# Patient Record
Sex: Male | Born: 1955 | Race: Black or African American | Hispanic: No | Marital: Married | Smoking: Never smoker
Health system: Southern US, Community
[De-identification: ages and names within clinical notes are randomized; demographics above are authoritative.]

## PROBLEM LIST (undated history)

## (undated) DIAGNOSIS — I10 Essential (primary) hypertension: Secondary | ICD-10-CM

## (undated) DIAGNOSIS — E119 Type 2 diabetes mellitus without complications: Secondary | ICD-10-CM

---

## 2013-11-23 DIAGNOSIS — G4731 Primary central sleep apnea: Secondary | ICD-10-CM | POA: Insufficient documentation

## 2013-11-23 DIAGNOSIS — R5383 Other fatigue: Secondary | ICD-10-CM | POA: Insufficient documentation

## 2013-11-23 DIAGNOSIS — G4710 Hypersomnia, unspecified: Secondary | ICD-10-CM | POA: Insufficient documentation

## 2013-11-23 DIAGNOSIS — R0683 Snoring: Secondary | ICD-10-CM | POA: Insufficient documentation

## 2013-11-24 DIAGNOSIS — N3281 Overactive bladder: Secondary | ICD-10-CM | POA: Insufficient documentation

## 2013-11-24 DIAGNOSIS — E119 Type 2 diabetes mellitus without complications: Secondary | ICD-10-CM | POA: Insufficient documentation

## 2013-11-24 DIAGNOSIS — R351 Nocturia: Secondary | ICD-10-CM | POA: Insufficient documentation

## 2013-11-24 DIAGNOSIS — N400 Enlarged prostate without lower urinary tract symptoms: Secondary | ICD-10-CM | POA: Insufficient documentation

## 2013-11-24 DIAGNOSIS — I10 Essential (primary) hypertension: Secondary | ICD-10-CM | POA: Insufficient documentation

## 2013-11-24 DIAGNOSIS — R350 Frequency of micturition: Secondary | ICD-10-CM | POA: Insufficient documentation

## 2017-09-18 ENCOUNTER — Encounter (HOSPITAL_BASED_OUTPATIENT_CLINIC_OR_DEPARTMENT_OTHER): Payer: Self-pay | Admitting: *Deleted

## 2017-09-18 ENCOUNTER — Other Ambulatory Visit: Payer: Self-pay

## 2017-09-18 DIAGNOSIS — Y998 Other external cause status: Secondary | ICD-10-CM | POA: Diagnosis not present

## 2017-09-18 DIAGNOSIS — X102XXA Contact with fats and cooking oils, initial encounter: Secondary | ICD-10-CM | POA: Insufficient documentation

## 2017-09-18 DIAGNOSIS — T25232A Burn of second degree of left toe(s) (nail), initial encounter: Secondary | ICD-10-CM | POA: Insufficient documentation

## 2017-09-18 DIAGNOSIS — Y939 Activity, unspecified: Secondary | ICD-10-CM | POA: Insufficient documentation

## 2017-09-18 DIAGNOSIS — T25032A Burn of unspecified degree of left toe(s) (nail), initial encounter: Secondary | ICD-10-CM | POA: Diagnosis present

## 2017-09-18 DIAGNOSIS — Y929 Unspecified place or not applicable: Secondary | ICD-10-CM | POA: Insufficient documentation

## 2017-09-18 NOTE — ED Notes (Signed)
Pt provided with cool, moist washcloths to relieve pain. Pt offered tylenol or ibuprofen-refuses.
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
## 2017-09-19 ENCOUNTER — Other Ambulatory Visit: Payer: Self-pay

## 2017-09-19 ENCOUNTER — Emergency Department (HOSPITAL_BASED_OUTPATIENT_CLINIC_OR_DEPARTMENT_OTHER)
Admission: EM | Admit: 2017-09-19 | Discharge: 2017-09-19 | Disposition: A | Discharge status: Home / Self Care | Attending: Emergency Medicine | Admitting: Emergency Medicine

## 2017-09-19 DIAGNOSIS — T25232A Burn of second degree of left toe(s) (nail), initial encounter: Secondary | ICD-10-CM

## 2017-09-19 MED ORDER — HYDROCODONE-ACETAMINOPHEN 5-325 MG PO TABS: 1.0000 | ORAL_TABLET | Freq: Once | ORAL | Status: DC

## 2017-09-19 MED ORDER — HYDROCODONE-ACETAMINOPHEN 5-325 MG PO TABS: 1.0000 | ORAL_TABLET | ORAL | 0 refills | Status: DC | PRN

## 2017-09-19 MED ORDER — BACITRACIN ZINC 500 UNIT/GM EX OINT: 1.0000 "application " | TOPICAL_OINTMENT | Freq: Two times a day (BID) | CUTANEOUS | 0 refills | Status: DC

## 2017-09-19 NOTE — Discharge Instructions (Signed)
Keep burn clean and dry.  Apply the antibiotic ointment after the blister opens.  Follow-up with the foot doctor next week.  Return to the ED if you develop new or worsening symptoms.
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
## 2017-09-19 NOTE — ED Notes (Signed)
PMS intact before and after. Pt tolerated well. All questions answered. 

## 2017-09-19 NOTE — ED Notes (Signed)
Pt declined another set of vital signs. States he will take his BP meds at home.

## 2017-10-13 ENCOUNTER — Other Ambulatory Visit: Payer: Self-pay

## 2017-10-13 ENCOUNTER — Ambulatory Visit (INDEPENDENT_AMBULATORY_CARE_PROVIDER_SITE_OTHER): Admitting: Podiatry

## 2017-10-13 ENCOUNTER — Ambulatory Visit: Payer: Self-pay

## 2017-10-13 DIAGNOSIS — L97522 Non-pressure chronic ulcer of other part of left foot with fat layer exposed: Secondary | ICD-10-CM | POA: Diagnosis not present

## 2017-10-13 DIAGNOSIS — E08621 Diabetes mellitus due to underlying condition with foot ulcer: Secondary | ICD-10-CM | POA: Diagnosis not present

## 2017-10-13 DIAGNOSIS — E0843 Diabetes mellitus due to underlying condition with diabetic autonomic (poly)neuropathy: Secondary | ICD-10-CM | POA: Diagnosis not present

## 2017-10-13 DIAGNOSIS — T25322A Burn of third degree of left foot, initial encounter: Secondary | ICD-10-CM
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
## 2017-11-03 ENCOUNTER — Encounter: Admitting: Podiatry

## 2017-11-09 NOTE — Progress Notes (Signed)
This encounter was created in error - please disregard.

## 2019-10-07 ENCOUNTER — Emergency Department (HOSPITAL_BASED_OUTPATIENT_CLINIC_OR_DEPARTMENT_OTHER)
Admission: EM | Admit: 2019-10-07 | Discharge: 2019-10-07 | Disposition: A | Discharge status: Home / Self Care | Attending: Emergency Medicine | Admitting: Emergency Medicine

## 2019-10-07 ENCOUNTER — Emergency Department (HOSPITAL_BASED_OUTPATIENT_CLINIC_OR_DEPARTMENT_OTHER)

## 2019-10-07 ENCOUNTER — Other Ambulatory Visit: Payer: Self-pay

## 2019-10-07 DIAGNOSIS — Z79899 Other long term (current) drug therapy: Secondary | ICD-10-CM | POA: Diagnosis not present

## 2019-10-07 DIAGNOSIS — G5700 Lesion of sciatic nerve, unspecified lower limb: Secondary | ICD-10-CM | POA: Diagnosis not present

## 2019-10-07 DIAGNOSIS — Y929 Unspecified place or not applicable: Secondary | ICD-10-CM | POA: Diagnosis not present

## 2019-10-07 DIAGNOSIS — W1789XA Other fall from one level to another, initial encounter: Secondary | ICD-10-CM | POA: Insufficient documentation

## 2019-10-07 DIAGNOSIS — E119 Type 2 diabetes mellitus without complications: Secondary | ICD-10-CM | POA: Diagnosis not present

## 2019-10-07 DIAGNOSIS — Y939 Activity, unspecified: Secondary | ICD-10-CM | POA: Insufficient documentation

## 2019-10-07 DIAGNOSIS — W19XXXA Unspecified fall, initial encounter: Secondary | ICD-10-CM

## 2019-10-07 DIAGNOSIS — M7918 Myalgia, other site: Secondary | ICD-10-CM

## 2019-10-07 DIAGNOSIS — M79632 Pain in left forearm: Secondary | ICD-10-CM

## 2019-10-07 DIAGNOSIS — I10 Essential (primary) hypertension: Secondary | ICD-10-CM | POA: Insufficient documentation

## 2019-10-07 DIAGNOSIS — S43422A Sprain of left rotator cuff capsule, initial encounter: Secondary | ICD-10-CM | POA: Diagnosis not present

## 2019-10-07 DIAGNOSIS — Z7984 Long term (current) use of oral hypoglycemic drugs: Secondary | ICD-10-CM | POA: Insufficient documentation

## 2019-10-07 DIAGNOSIS — Y999 Unspecified external cause status: Secondary | ICD-10-CM | POA: Insufficient documentation

## 2019-10-07 DIAGNOSIS — S46002A Unspecified injury of muscle(s) and tendon(s) of the rotator cuff of left shoulder, initial encounter: Secondary | ICD-10-CM | POA: Diagnosis present

## 2019-10-07 MED ORDER — CYCLOBENZAPRINE HCL 10 MG PO TABS
10.0000 mg | ORAL_TABLET | Freq: Once | ORAL | Status: AC
  Administered 2019-10-07: 10 mg via ORAL
  Filled 2019-10-07: qty 1

## 2019-10-07 MED ORDER — HYDRALAZINE HCL 25 MG PO TABS
50.0000 mg | ORAL_TABLET | Freq: Once | ORAL | Status: AC
  Administered 2019-10-07: 50 mg via ORAL
  Filled 2019-10-07: qty 2

## 2019-10-07 MED ORDER — CYCLOBENZAPRINE HCL 10 MG PO TABS
10.0000 mg | ORAL_TABLET | Freq: Two times a day (BID) | ORAL | 0 refills | Status: DC | PRN
Start: 2019-10-07 — End: 2019-12-13

## 2019-10-07 MED ORDER — IBUPROFEN 400 MG PO TABS
400.0000 mg | ORAL_TABLET | Freq: Once | ORAL | Status: AC
  Administered 2019-10-07: 400 mg via ORAL
  Filled 2019-10-07: qty 1

## 2019-10-07 MED ORDER — LIDOCAINE 5 % EX PTCH: 1.0000 | MEDICATED_PATCH | CUTANEOUS | 0 refills | Status: DC

## 2019-10-07 NOTE — ED Notes (Signed)
Patient transported to X-ray 

## 2019-10-07 NOTE — ED Provider Notes (Signed)
[place] EMERGENCY DEPARTMENT Provider Note   CSN: [id number] Arrival date & time: 10/07/19  0645     History Chief Complaint  Patient presents with  . Leg Injury  . Arm Injury    Samuel Atkins is a 64 y.o. male.  HPI      Was on other side of brick wall, about 2 feet up from pavement, was shoveling and stepped wrong and hit the wall and fell 2 feet down onto the concrete onto left side.  Had pain left arm and leg at that time, (shoulder, hip)-was not seen initially though it would get better  Continuing to have pain to left forearm radial side closer to elbow, shoulder pain with lifting, begins to getpain just before 90 degrees Has been taking aspirins 2 500mg  aspirins a day Still having some hip pain 4-5/10 At night it increases   No head trauma, no headaches, no LOC No neck pain, back pain  No numbness, no weakness Had not taken blood pressure medications this AM    Patient Active Problem List   Diagnosis Date Noted  . Benign prostatic hyperplasia 11/24/2013  . Diabetes mellitus ([place]) 11/24/2013  . Hypertension 11/24/2013  . Increased frequency of urination 11/24/2013  . Nocturia 11/24/2013  . Overactive bladder 11/24/2013  . Snoring 11/23/2013  . Other fatigue 11/23/2013  . Hypersomnia 11/23/2013  . Central sleep apnea 11/23/2013    No past surgical history on file.     No family history on file.  Social History   Tobacco Use  . Smoking status: Never Smoker  . Smokeless tobacco: Never Used  Substance Use Topics  . Alcohol use: Yes    Comment: socially  . Drug use: Not Currently    Home Medications Prior to Admission medications   Medication Sig Start Date End Date Taking? Authorizing Provider  amLODipine-benazepril (LOTREL) 5-40 MG capsule Take by mouth.    [provider]  atorvastatin (LIPITOR) 40 MG tablet Take by mouth.    [provider]  bacitracin ointment Apply 1 application topically 2 (two) times  daily. Apply to burn site after blister opens 09/19/17   Rancour, Annie Main, MD  canagliflozin (INVOKANA) 300 MG TABS tablet Take by mouth.    [provider]  Cholecalciferol (D-5000) 5000 units TABS Take by mouth.    [provider]  cyclobenzaprine (FLEXERIL) 10 MG tablet Take 1 tablet (10 mg total) by mouth 2 (two) times daily as needed for muscle spasms. 10/07/19   Gareth Morgan, MD  doxycycline (VIBRAMYCIN) 100 MG capsule  09/20/17   [provider]  glimepiride (AMARYL) 4 MG tablet  09/07/17   [provider]  hydrALAZINE (APRESOLINE) 50 MG tablet Take by mouth.    [provider]  hydrochlorothiazide (HYDRODIURIL) 25 MG tablet Take 25 mg by mouth daily.    [provider]  HYDROcodone-acetaminophen (NORCO/VICODIN) 5-325 MG tablet Take 1 tablet by mouth every 4 (four) hours as needed. 09/19/17   Rancour, Annie Main, MD  lidocaine (LIDODERM) 5 % Place 1 patch onto the skin daily. Remove & Discard patch within 12 hours or as directed by MD 10/07/19   Gareth Morgan, MD  metFORMIN (GLUCOPHAGE) 1000 MG tablet Take 1,000 mg by mouth 2 (two) times daily with a meal.    [provider]  potassium chloride (K-DUR) 10 MEQ tablet  09/16/17   [provider]  sitaGLIPtin (JANUVIA) 50 MG tablet Take 50 mg by mouth daily.    [provider]  SitaGLIPtin-MetFORMIN HCl (JANUMET XR) [phone] MG TB24  10/17/13   [provider]  solifenacin (VESICARE) 5 MG tablet Take by mouth.    [provider]  tadalafil (CIALIS) 10 MG tablet  11/14/13   [provider]  tolterodine (DETROL LA) 4 MG 24 hr capsule  10/15/13   [provider]  Vitamin D, Ergocalciferol, (DRISDOL) 50000 units CAPS capsule  10/31/13   [provider]    Allergies    Patient has no known allergies.  Review of Systems   Review of Systems  Constitutional: Negative for fever.  Eyes: Negative for visual disturbance.   Respiratory: Negative for shortness of breath.   Cardiovascular: Negative for chest pain.  Gastrointestinal: Negative for abdominal pain, nausea and vomiting.  Genitourinary: Negative for difficulty urinating.  Musculoskeletal: Positive for arthralgias. Negative for back pain and neck pain.  Skin: Negative for rash.  Neurological: Negative for dizziness, syncope, facial asymmetry, weakness, numbness and headaches.    Physical Exam Updated Vital Signs BP (!) 196/108 (BP Location: Right Arm)   Pulse (!) 57   Temp 98.2 F (36.8 C) (Oral)   Resp 16   Ht 5' 7.5" (1.715 m)   Wt 102.1 kg   SpO2 100%   BMI 34.72 kg/m   Physical Exam Vitals and nursing note reviewed.  Constitutional:      General: He is not in acute distress.    Appearance: Normal appearance. He is not ill-appearing, toxic-appearing or diaphoretic.  HENT:     Head: Normocephalic.  Eyes:     Conjunctiva/sclera: Conjunctivae normal.  Cardiovascular:     Rate and Rhythm: Normal rate and regular rhythm.     Pulses: Normal pulses.  Pulmonary:     Effort: Pulmonary effort is normal. No respiratory distress.  Musculoskeletal:        General: No deformity or signs of injury.     Cervical back: No rigidity.     Comments: Limited ROM left shoulder, able to raise to just below 90 degrees, significant pain at that point and states cannot raise higher Normal elbow ROM Tenderness radial left forearm Normal wrist ROM and strength, normal opponens, finger abduction  Tenderness sciatic notch, left hip Normal ROM hip Tenderness dorsum left hand 4th MTP, normal finger extension and flexion  Skin:    General: Skin is warm and dry.     Coloration: Skin is not jaundiced or pale.  Neurological:     General: No focal deficit present.     Mental Status: He is alert and oriented to person, place, and time.     ED Results / Procedures / Treatments   Labs (all labs ordered are listed, but only abnormal results are  displayed) Labs Reviewed - No data to display  EKG None  Radiology DG Forearm Left  Result Date: 10/07/2019 CLINICAL DATA:  Status post fall 3 weeks ago with left arm pain. EXAM: LEFT FOREARM - 2 VIEW COMPARISON:  None. FINDINGS: There is no evidence of fracture or other focal bone lesions. Soft tissues are unremarkable. IMPRESSION: Negative. Electronically Signed   By: Abelardo Diesel M.D.   On: 10/07/2019 08:21   DG Shoulder Left  Result Date: 10/07/2019 CLINICAL DATA:  Status post fall 3 weeks ago with left shoulder pain EXAM: LEFT SHOULDER - 2+ VIEW COMPARISON:  None. FINDINGS: There is no evidence of fracture or dislocation. Degenerative joint changes of the left acromioclavicular joint with osteophyte formation are noted. Soft tissues are unremarkable. IMPRESSION:  No acute fracture or dislocation. Electronically Signed   By: Abelardo Diesel M.D.   On: 10/07/2019 08:23   DG Hand Complete Left  Result Date: 10/07/2019 CLINICAL DATA:  Status post fall 3 weeks ago with left hand pain EXAM: LEFT HAND - COMPLETE 3+ VIEW COMPARISON:  None. FINDINGS: There is no evidence of fracture or dislocation. Soft tissues are unremarkable. IMPRESSION: Negative. Electronically Signed   By: Abelardo Diesel M.D.   On: 10/07/2019 08:25   DG Hip Unilat W or Wo Pelvis 2-3 Views Left  Result Date: 10/07/2019 CLINICAL DATA:  Status post fall 3 weeks ago with left EXAM: DG HIP (WITH OR WITHOUT PELVIS) 2-3V LEFT COMPARISON:  None. FINDINGS: There is no evidence of hip fracture or dislocation. Degenerative joint changes of the spine and bilateral hips are identified. There is no evidence of arthropathy or other focal bone abnormality. IMPRESSION: No acute fracture or dislocation. Electronically Signed   By: Abelardo Diesel M.D.   On: 10/07/2019 08:22    Procedures Procedures (including critical care time)  Medications Ordered in ED Medications  hydrALAZINE (APRESOLINE) tablet 50 mg (50 mg Oral Given 10/07/19 0759)   cyclobenzaprine (FLEXERIL) tablet 10 mg (10 mg Oral Given 10/07/19 0759)  ibuprofen (ADVIL) tablet 400 mg (400 mg Oral Given 10/07/19 0759)    ED Course  I have reviewed the triage vital signs and the nursing notes.  Pertinent labs & imaging results that were available during my care of the patient were reviewed by me and considered in my medical decision making (see chart for details).    MDM Rules/Calculators/A&P                          65yo male with history above presents with concern for fall 3 weeks ago with continuing pain to left shoulder, forearm and hip.  Doubt intracranial, cervical or other injuries.   XR show no sign of fracture. Suspect rotator cuff pathology. Given sling and recommend ROM activities and follow up with sports medicine who may recommend surgical orthopedic follow up pending evaluation.  Given rx for flexeril, lidoderm, recommend ibuprofen/tylenol. No known hx of renal disease.   Hypertension today however he has not taken his morning medications and is asymptomatic. No signs of hypertensive emergency by history or exam.  Given home dose of hydralazine and recommend taking other regular medications upon returning home. Patient discharged in stable condition with understanding of reasons to return.   Final Clinical Impression(s) / ED Diagnoses Final diagnoses:  Fall, initial encounter  Sprain of left rotator cuff capsule, initial encounter  Piriformis muscle pain  Pain of left forearm    Rx / DC Orders ED Discharge Orders         Ordered    cyclobenzaprine (FLEXERIL) 10 MG tablet  2 times daily PRN     Discontinue  Reprint     10/07/19 0829    lidocaine (LIDODERM) 5 %  Every 24 hours     Discontinue  Reprint     10/07/19 0300           Gareth Morgan, MD 10/07/19 [phone]

## 2019-10-07 NOTE — ED Triage Notes (Signed)
Pt states fell on left side on concrete 3 weeks ago, soreness along left arm and leg, has been taking aspirin 650mg  each day with little improvement.

## 2019-10-12 ENCOUNTER — Ambulatory Visit: Payer: Self-pay

## 2019-10-12 ENCOUNTER — Other Ambulatory Visit: Payer: Self-pay

## 2019-10-12 ENCOUNTER — Ambulatory Visit (INDEPENDENT_AMBULATORY_CARE_PROVIDER_SITE_OTHER): Admitting: Family Medicine

## 2019-10-12 VITALS — BP 148/86 | Ht 68.0 in | Wt 225.0 lb

## 2019-10-12 DIAGNOSIS — S43002A Unspecified subluxation of left shoulder joint, initial encounter: Secondary | ICD-10-CM

## 2019-10-12 DIAGNOSIS — M25512 Pain in left shoulder: Secondary | ICD-10-CM

## 2019-10-12 NOTE — Patient Instructions (Signed)
Nice to meet you Please try ice  Please continue the sling for one week and then wean out  Please try the range of motion exercises  Please try the duexis for 3 days straight and then as needed   Please send me a message in MyChart with any questions or updates.  Please see me back in 2 weeks.   --Dr. Raeford Razor

## 2019-10-12 NOTE — Assessment & Plan Note (Signed)
Trauma happened about 3 weeks ago.  Symptoms are still ongoing.  Ultrasound was revealing for effusion and likely traumatic bursitis.  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 -Counseled on home exercise therapy and supportive care. -Provided Duexis samples. -If no improvement consider glenohumeral injection and physical therapy.

## 2019-10-12 NOTE — Progress Notes (Signed)
Samuel Atkins - 64 y.o. male MRN [id number]  Date of birth: [birth year]  SUBJECTIVE:  Including CC & ROS.  No chief complaint on file.   Samuel Atkins is a 64 y.o. male that is presenting with left shoulder pain.  He recently had a fall from a fairly decent height.  Since that time he has had pain of the localized left shoulder.  Has been using a sling.  Was seen in the emergency department.  Pain is worse with any external rotation or abduction..  Independent review of the left shoulder x-ray from 7/3 shows degenerative changes of the [facility] joint.  Independent review of the left forearm x-ray from 7/3 shows no acute abnormalities.   Review of Systems See HPI   HISTORY: Past Medical, Surgical, Social, and Family History Reviewed & Updated per EMR.   Pertinent Historical Findings include:  No past medical history on file.  No past surgical history on file.  No family history on file.  Social History   Socioeconomic History  . Marital status: Married    Spouse name: Not on file  . Number of children: Not on file  . Years of education: Not on file  . Highest education level: Not on file  Occupational History  . Not on file  Tobacco Use  . Smoking status: Never Smoker  . Smokeless tobacco: Never Used  Substance and Sexual Activity  . Alcohol use: Yes    Comment: socially  . Drug use: Not Currently  . Sexual activity: Not on file  Other Topics Concern  . Not on file  Social History Narrative  . Not on file   Social Determinants of Health   Financial Resource Strain:   . Difficulty of Paying Living Expenses:   Food Insecurity:   . Worried About Charity fundraiser in the Last Year:   . Arboriculturist in the Last Year:   Transportation Needs:   . Film/video editor (Medical):   Marland Kitchen Lack of Transportation (Non-Medical):   Physical Activity:   . Days of Exercise per Week:   . Minutes of Exercise per Session:   Stress:   . Feeling of Stress :   Social  Connections:   . Frequency of Communication with Friends and Family:   . Frequency of Social Gatherings with Friends and Family:   . Attends Religious Services:   . Active Member of Clubs or Organizations:   . Attends Archivist Meetings:   Marland Kitchen Marital Status:   Intimate Partner Violence:   . Fear of Current or Ex-Partner:   . Emotionally Abused:   Marland Kitchen Physically Abused:   . Sexually Abused:      PHYSICAL EXAM:  VS: BP (!) 148/86   Ht 5\' 8"  (1.727 m)   Wt 225 lb (102.1 kg)   BMI 34.21 kg/m  Physical Exam Gen: NAD, alert, cooperative with exam, well-appearing MSK:  Left shoulder: Normal internal rotation. Pain with external rotation. Pain with abduction. Strength with empty can testing. Neurovascular intact  Limited ultrasound: Left shoulder:  Degenerative changes appreciated of the biceps tendon. Effusion noted superficial to the subscapularis. Bursitis associated superficially to the supraspinatus with chronic changes of the supraspinatus. No posterior glenohumeral effusion  Summary: Acute anterior effusion and overlying bursitis of the supraspinatus.  Ultrasound and interpretation by Clearance Coots, MD    ASSESSMENT & PLAN:   Subluxation of left shoulder joint Trauma happened about 3 weeks ago.  Symptoms are still ongoing.  Ultrasound was revealing for effusion and likely traumatic bursitis.  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 -Counseled on home exercise therapy and supportive care. -Provided Duexis samples. -If no improvement consider glenohumeral injection and physical therapy.

## 2019-10-12 NOTE — Progress Notes (Signed)
Medication Samples have been provided to the patient.  Drug name: duexix       Strength: 800/26.6mg         Qty: 2 boxes  LOT: [id number]  Exp.Date: 06/2020  Dosing instructions: take one tab three times a day  The patient has been instructed regarding the correct time, dose, and frequency of taking this medication, including desired effects and most common side effects.   April Manson 11:25 AM 10/12/2019

## 2019-10-31 ENCOUNTER — Other Ambulatory Visit: Payer: Self-pay

## 2019-10-31 ENCOUNTER — Encounter: Payer: Self-pay | Admitting: Family Medicine

## 2019-10-31 ENCOUNTER — Ambulatory Visit (INDEPENDENT_AMBULATORY_CARE_PROVIDER_SITE_OTHER): Admitting: Family Medicine

## 2019-10-31 DIAGNOSIS — S43002D Unspecified subluxation of left shoulder joint, subsequent encounter: Secondary | ICD-10-CM | POA: Diagnosis not present

## 2019-10-31 NOTE — Patient Instructions (Signed)
Good to see you Please use ice as needed  Please continue the range of motion movements.  Please use the duexis as needed   Please send me a message in MyChart with any questions or updates.  Please see me back in 4-6 weeks.   --Dr. Raeford Razor

## 2019-10-31 NOTE — Progress Notes (Signed)
Medication Samples have been provided to the patient.  Drug name: Duexis      Strength: 800mg /26.6mg         Qty: 1 box  LOT: [id number]  Exp.Date: 07/2020  Dosing instructions: take 1 tablet by mouth three (3) times a day.  The patient has been instructed regarding the correct time, dose, and frequency of taking this medication, including desired effects and most common side effects.   Sherrie George, [place] 11:24 AM 10/31/2019

## 2019-10-31 NOTE — Progress Notes (Signed)
  Samuel Atkins - 64 y.o. male MRN [id number]  Date of birth: 07-20-1955  SUBJECTIVE:  Including CC & ROS.  Chief Complaint  Patient presents with  . Follow-up    left shoulder    Samuel Atkins is a 64 y.o. male that is presenting with ongoing left shoulder pain.  His range of motion has improved.  He takes Duexis at night to help with sleep.  Pain is localized to the shoulder.  Pain occurs intermittently but has improved.   Review of Systems See HPI   HISTORY: Past Medical, Surgical, Social, and Family History Reviewed & Updated per EMR.   Pertinent Historical Findings include:  No past medical history on file.  No past surgical history on file.  No family history on file.  Social History   Socioeconomic History  . Marital status: Married    Spouse name: Not on file  . Number of children: Not on file  . Years of education: Not on file  . Highest education level: Not on file  Occupational History  . Not on file  Tobacco Use  . Smoking status: Never Smoker  . Smokeless tobacco: Never Used  Substance and Sexual Activity  . Alcohol use: Yes    Comment: socially  . Drug use: Not Currently  . Sexual activity: Not on file  Other Topics Concern  . Not on file  Social History Narrative  . Not on file   Social Determinants of Health   Financial Resource Strain:   . Difficulty of Paying Living Expenses:   Food Insecurity:   . Worried About Charity fundraiser in the Last Year:   . Arboriculturist in the Last Year:   Transportation Needs:   . Film/video editor (Medical):   Marland Kitchen Lack of Transportation (Non-Medical):   Physical Activity:   . Days of Exercise per Week:   . Minutes of Exercise per Session:   Stress:   . Feeling of Stress :   Social Connections:   . Frequency of Communication with Friends and Family:   . Frequency of Social Gatherings with Friends and Family:   . Attends Religious Services:   . Active Member of Clubs or Organizations:   .  Attends Archivist Meetings:   Marland Kitchen Marital Status:   Intimate Partner Violence:   . Fear of Current or Ex-Partner:   . Emotionally Abused:   Marland Kitchen Physically Abused:   . Sexually Abused:      PHYSICAL EXAM:  VS: BP (!) 133/80   Pulse 92   Ht 5\' 8"  (1.727 m)   Wt (!) 225 lb (102.1 kg)   BMI 34.21 kg/m  Physical Exam Gen: NAD, alert, cooperative with exam, well-appearing MSK:  Left shoulder: Limited active abduction and external rotation. Pain with external rotation and abduction. Neurovascularly intact     ASSESSMENT & PLAN:   Subluxation of left shoulder joint His motion is limited but has improved.  Pain is still present but is improving as well.  Still seems likely that he had a subluxation with irritation of the joint capsule. -Counseled on home exercise therapy and supportive care. -Duexis samples provided. -Would consider glenohumeral injection or physical therapy.

## 2019-10-31 NOTE — Progress Notes (Signed)
Medication Samples have been provided to the patient.  Drug name: Duexis      Strength: 800mg /26.6mg         Qty: 1 box  LOT: [id number]  Exp.Date: 06/2020  Dosing instructions: take 1 tablet by mouth three (3) times a day.  The patient has been instructed regarding the correct time, dose, and frequency of taking this medication, including desired effects and most common side effects.   Sherrie George, [place] 11:26 AM 10/31/2019

## 2019-10-31 NOTE — Assessment & Plan Note (Signed)
His motion is limited but has improved.  Pain is still present but is improving as well.  Still seems likely that he had a subluxation with irritation of the joint capsule. -Counseled on home exercise therapy and supportive care. -Duexis samples provided. -Would consider glenohumeral injection or physical therapy.

## 2019-12-13 ENCOUNTER — Other Ambulatory Visit: Payer: Self-pay

## 2019-12-13 ENCOUNTER — Encounter (HOSPITAL_BASED_OUTPATIENT_CLINIC_OR_DEPARTMENT_OTHER): Payer: Self-pay

## 2019-12-13 ENCOUNTER — Emergency Department (HOSPITAL_BASED_OUTPATIENT_CLINIC_OR_DEPARTMENT_OTHER)
Admission: EM | Admit: 2019-12-13 | Discharge: 2019-12-13 | Disposition: A | Discharge status: Home / Self Care | Attending: Emergency Medicine | Admitting: Emergency Medicine

## 2019-12-13 ENCOUNTER — Emergency Department (HOSPITAL_BASED_OUTPATIENT_CLINIC_OR_DEPARTMENT_OTHER)

## 2019-12-13 DIAGNOSIS — E119 Type 2 diabetes mellitus without complications: Secondary | ICD-10-CM | POA: Insufficient documentation

## 2019-12-13 DIAGNOSIS — M79645 Pain in left finger(s): Secondary | ICD-10-CM | POA: Diagnosis present

## 2019-12-13 DIAGNOSIS — Z7984 Long term (current) use of oral hypoglycemic drugs: Secondary | ICD-10-CM | POA: Insufficient documentation

## 2019-12-13 DIAGNOSIS — Z79899 Other long term (current) drug therapy: Secondary | ICD-10-CM | POA: Insufficient documentation

## 2019-12-13 DIAGNOSIS — I10 Essential (primary) hypertension: Secondary | ICD-10-CM | POA: Insufficient documentation

## 2019-12-13 HISTORY — DX: Type 2 diabetes mellitus without complications: E11.9

## 2019-12-13 HISTORY — DX: Essential (primary) hypertension: I10

## 2019-12-13 MED ORDER — HYDROCODONE-ACETAMINOPHEN 5-325 MG PO TABS: 1.0000 | ORAL_TABLET | Freq: Four times a day (QID) | ORAL | 0 refills | Status: DC | PRN

## 2019-12-13 MED ORDER — HYDRALAZINE HCL 25 MG PO TABS
100.0000 mg | ORAL_TABLET | Freq: Two times a day (BID) | ORAL | Status: DC
  Administered 2019-12-13: 100 mg via ORAL
  Filled 2019-12-13: qty 4

## 2019-12-13 MED ORDER — HYDRALAZINE HCL 100 MG PO TABS: 100.0000 mg | ORAL_TABLET | Freq: Two times a day (BID) | ORAL | 0 refills | Status: DC

## 2019-12-13 MED ORDER — CARVEDILOL 6.25 MG PO TABS: 6.2500 mg | ORAL_TABLET | Freq: Two times a day (BID) | ORAL | 0 refills | Status: DC

## 2019-12-13 MED ORDER — AMLODIPINE BESY-BENAZEPRIL HCL 5-40 MG PO CAPS: 1.0000 | ORAL_CAPSULE | Freq: Every day | ORAL | 0 refills | Status: DC

## 2019-12-13 MED ORDER — PREDNISONE 20 MG PO TABS
40.0000 mg | ORAL_TABLET | Freq: Every day | ORAL | 0 refills | Status: AC
Start: 2019-12-13 — End: 2019-12-17

## 2019-12-13 MED ORDER — AMLODIPINE BESYLATE 5 MG PO TABS
5.0000 mg | ORAL_TABLET | Freq: Once | ORAL | Status: AC
  Administered 2019-12-13: 5 mg via ORAL
  Filled 2019-12-13: qty 1

## 2019-12-13 MED ORDER — CARVEDILOL 6.25 MG PO TABS
6.2500 mg | ORAL_TABLET | Freq: Two times a day (BID) | ORAL | Status: DC
  Administered 2019-12-13: 6.25 mg via ORAL
  Filled 2019-12-13: qty 1

## 2019-12-13 MED FILL — predniSONE 20 MG TABS: 20 | 4 days supply | Qty: 8 | Fill #0

## 2019-12-13 MED FILL — HYDROCODON-APAP 5-325: 5-325 | 1 days supply | Qty: 8 | Fill #0

## 2019-12-13 NOTE — ED Provider Notes (Signed)
[facility] EMERGENCY DEPARTMENT Provider Note   CSN: [id number] Arrival date & time: 12/13/19  1102     History Chief Complaint  Patient presents with  . Finger Injury    Samuel Atkins is a 64 y.o. male past mostly of diabetes, hypertension who presents for evaluation of pain, swelling noted to his left middle finger x2 days.  He denies any preceding trauma, injury.  He states that the pain is worse when he tries to move it.  He may have noticed some warmth but has not noticed any redness.  He denies any numbness/weakness.  He has not noted any fevers.  Patient reports that he did not take his blood pressure medications today.  He states that he took them yesterday but ran out of his prescriptions and did not have any this morning to take.  He denies any chest pain, difficulty breathing.  No prior history of gout.  The history is provided by the patient.       Past Medical History:  Diagnosis Date  . Diabetes mellitus without complication ([place])   . Hypertension     Patient Active Problem List   Diagnosis Date Noted  . Subluxation of left shoulder joint 10/12/2019  . Benign prostatic hyperplasia 11/24/2013  . Diabetes mellitus ([place]) 11/24/2013  . Hypertension 11/24/2013  . Increased frequency of urination 11/24/2013  . Nocturia 11/24/2013  . Overactive bladder 11/24/2013  . Snoring 11/23/2013  . Other fatigue 11/23/2013  . Hypersomnia 11/23/2013  . Central sleep apnea 11/23/2013    History reviewed. No pertinent surgical history.     No family history on file.  Social History   Tobacco Use  . Smoking status: Never Smoker  . Smokeless tobacco: Never Used  Substance Use Topics  . Alcohol use: Yes    Comment: socially  . Drug use: Not Currently    Home Medications Prior to Admission medications   Medication Sig Start Date End Date Taking? Authorizing Provider  amLODipine-benazepril (LOTREL) 5-40 MG capsule Take 1 capsule by mouth daily. 12/13/19  01/12/20  Volanda Napoleon, PA-C  carvedilol (COREG) 6.25 MG tablet Take 1 tablet (6.25 mg total) by mouth 2 (two) times daily. 12/13/19 01/12/20  Volanda Napoleon, PA-C  glimepiride (AMARYL) 4 MG tablet  09/07/17   [provider]  hydrALAZINE (APRESOLINE) 100 MG tablet Take 1 tablet (100 mg total) by mouth 2 (two) times daily. 12/13/19 01/12/20  Volanda Napoleon, PA-C  HYDROcodone-acetaminophen (NORCO/VICODIN) 5-325 MG tablet Take 1-2 tablets by mouth every 6 (six) hours as needed. 12/13/19   Volanda Napoleon, PA-C  metFORMIN (GLUCOPHAGE) 1000 MG tablet Take 1,000 mg by mouth 2 (two) times daily with a meal.    [provider]  predniSONE (DELTASONE) 20 MG tablet Take 2 tablets (40 mg total) by mouth daily for 4 days. 12/13/19 12/17/19  Volanda Napoleon, PA-C  sitaGLIPtin (JANUVIA) 50 MG tablet Take 50 mg by mouth daily.    [provider]  solifenacin (VESICARE) 5 MG tablet Take by mouth.    [provider]  tolterodine (DETROL LA) 4 MG 24 hr capsule  10/15/13   [provider]    Allergies    Patient has no known allergies.  Review of Systems   Review of Systems  Constitutional: Negative for fever.  Respiratory: Negative for shortness of breath.   Cardiovascular: Negative for chest pain.  Musculoskeletal:       Left middle finger pain and swelling  Neurological: Negative for weakness and numbness.  All other systems reviewed and are negative.   Physical Exam Updated Vital Signs BP (!) 198/106 (BP Location: Right Arm)   Pulse 68   Temp 98.4 F (36.9 C) (Oral)   Resp 16   Ht 5\' 8"  (1.727 m)   Wt 104.3 kg   SpO2 98%   BMI 34.97 kg/m   Physical Exam Vitals and nursing note reviewed.  Constitutional:      Appearance: He is well-developed.  HENT:     Head: Normocephalic and atraumatic.  Eyes:     General: No scleral icterus.       Right eye: No discharge.        Left eye: No discharge.     Conjunctiva/sclera: Conjunctivae normal.   Pulmonary:     Effort: Pulmonary effort is normal.  Musculoskeletal:     Comments: Tenderness palpation noted to the flexor surface of the left middle finger, more particularly around the DIP and distal end. No phelon or paronychia noted.  There is some overlying soft tissue swelling.  No obvious warmth.  He is holding it slightly flexed.  He can somewhat flex but cannot make a full fist.  He gets to about 75% of extension with the finger before having pain.  Skin:    General: Skin is warm and dry.     Comments: Good distal cap refill.  LUE is not dusky in appearance or cool to touch.  Neurological:     Mental Status: He is alert.     Comments: Sensation intact along major nerve distributions of BUE  Psychiatric:        Speech: Speech normal.        Behavior: Behavior normal.          ED Results / Procedures / Treatments   Labs (all labs ordered are listed, but only abnormal results are displayed) Labs Reviewed - No data to display  EKG None  Radiology DG Finger Middle Left  Result Date: 12/13/2019 CLINICAL DATA:  Constant left middle finger pain noticed yesterday. Injured Sunday moving boxes. Limited range of motion. Pain mostly at distal interphalangeal joint. For difficult flex finger than to bend. EXAM: LEFT MIDDLE FINGER 2+V COMPARISON:  X-ray left hand 10/07/2019. FINDINGS: There is no evidence of fracture or dislocation. Similar appearing well-corticated 3 mm ossific density along the lateral aspect of the head of the third middle phalanx likely represents an old healed fracture fragment versus degenerative changes. No suspicious focal bone abnormality. Soft tissues are unremarkable. IMPRESSION: No acute displaced fracture or traumatic listhesis of the bones of the left middle finger. Electronically Signed   By: Iven Finn M.D.   On: 12/13/2019 12:15    Procedures Procedures (including critical care time)  Medications Ordered in ED Medications  carvedilol (COREG)  tablet 6.25 mg (6.25 mg Oral Given 12/13/19 1345)  hydrALAZINE (APRESOLINE) tablet 100 mg (100 mg Oral Given 12/13/19 1345)  amLODipine (NORVASC) tablet 5 mg (5 mg Oral Given 12/13/19 1344)    ED Course  I have reviewed the triage vital signs and the nursing notes.  Pertinent labs & imaging results that were available during my care of the patient were reviewed by me and considered in my medical decision making (see chart for details).  Clinical Course as of Dec 12 1537  Wed Sep 08, 431  271 64 year old male here with acute left middle finger pain going on for few days.  Said it kept him up  all night long with pain.  Worse with any type of movement.  On exam he is got some swelling and appears to be tender right at his DIP.  I do not see any open wounds there is no obvious felon.  He does not like Foley flexing or extending but I do not feel a lot of pain on the proximal tendon sheath.  X-rays not showing any acute fracture.  Will review with hand surgery on-call.   [MB]    Clinical Course User Index [MB] Hayden Rasmussen, MD   MDM Rules/Calculators/A&P                          64 year old male who presents for evaluation of left middle finger pain, swelling x2 days.  No trauma, injury, fall.  No fevers.  No numbness/weakness.  On initial ED arrival, he is afebrile, nontoxic-appearing.  Vital signs are stable.  He is neurovascularly intact.  On exam, he has tenderness palpation of the flexor surface but more so around the DIP joint with some overlying soft tissue swelling.  He does have some limited range of motion secondary to pain.  Doubt fracture dislocation given lack of trauma.  Question if this is gout versus inflammation.  He has no prior history of gout.  Low suspicion for flexor tenosynovitis but he does have some difficulty with passive extension as well as some tenderness along the flexor surface.  X-rays ordered at triage.  Additionally, patient is hypertensive here in the ED.  He  denies any chest pain, difficulty breathing.  He is on multiple blood pressure medications and states he did not take his medications this morning.  He states that the last time he took them was yesterday.  He reports that he is out of his prescriptions.  Oral medications ordered.  X-ray reviewed.  No evidence of acute fracture or dislocation.  Discussed with Dr. Grandville Silos (hand).  Given history/physical exam, it is not unreasonable to treat this as more of inflammatory process.  Patient can return to the ED if symptoms worsen and can follow-up with his office as needed.  Discussed results with patient.  Will give short course of pain medication and steroids to help with inflammation.  I discussed with patient that he should closely monitor his symptoms and return if he has any worsening redness, swelling that begins to spread.  Patient still hypertensive though improved.  I suspect this is most likely due to him not taking his medications.  I have provided him refills of his medications.  He denies any symptoms at this time.  Encouraged him to take his medications as directed. At this time, patient exhibits no emergent life-threatening condition that require further evaluation in ED. Patient had ample opportunity for questions and discussion. All patient's questions were answered with full understanding. Strict return precautions discussed. Patient expresses understanding and agreement to plan.   Portions of this note were generated with Lobbyist. Dictation errors may occur despite best attempts at proofreading.   Final Clinical Impression(s) / ED Diagnoses Final diagnoses:  Pain of finger of left hand    Rx / DC Orders ED Discharge Orders         Ordered    hydrALAZINE (APRESOLINE) 100 MG tablet  2 times daily        12/13/19 1530    amLODipine-benazepril (LOTREL) 5-40 MG capsule  Daily        12/13/19 1530  carvedilol (COREG) 6.25 MG tablet  2 times daily        12/13/19  1530    predniSONE (DELTASONE) 20 MG tablet  Daily        12/13/19 1530    HYDROcodone-acetaminophen (NORCO/VICODIN) 5-325 MG tablet  Every 6 hours PRN        12/13/19 1530           Volanda Napoleon, PA-C 12/13/19 1539    Hayden Rasmussen, MD 12/13/19 [phone]

## 2019-12-13 NOTE — Discharge Instructions (Signed)
You can take Tylenol or Ibuprofen as directed for pain. You can alternate Tylenol and Ibuprofen every 4 hours. If you take Tylenol at 1pm, then you can take Ibuprofen at 5pm. Then you can take Tylenol again at 9pm.   Take pain medications as directed for break through pain. Do not drive or operate machinery while taking this medication.   Take prednisone as directed.   As we discussed, you need to closely monitor your symptoms.  If you start having any worsening pain, redness or swelling of the finger or it starts becoming exquisitely tender, you need to return to the emergency department immediately.  You can follow-up with the referred orthopedic doctor for further evaluation as needed.  Additionally, I prescribed your blood pressure medications.  You need to have your primary care doctor refill these medications.  Is very important that you take them.

## 2019-12-13 NOTE — ED Triage Notes (Signed)
Pt arrives with swelling to left middle finger, unsure of injury or onset.

## 2019-12-19 ENCOUNTER — Ambulatory Visit: Admitting: Family Medicine

## 2020-06-13 DIAGNOSIS — E559 Vitamin D deficiency, unspecified: Secondary | ICD-10-CM | POA: Diagnosis not present

## 2020-06-13 DIAGNOSIS — E782 Mixed hyperlipidemia: Secondary | ICD-10-CM | POA: Diagnosis not present

## 2020-06-13 DIAGNOSIS — D649 Anemia, unspecified: Secondary | ICD-10-CM | POA: Diagnosis not present

## 2020-06-13 DIAGNOSIS — N3281 Overactive bladder: Secondary | ICD-10-CM | POA: Diagnosis not present

## 2020-06-13 DIAGNOSIS — I10 Essential (primary) hypertension: Secondary | ICD-10-CM | POA: Diagnosis not present

## 2020-06-13 DIAGNOSIS — E669 Obesity, unspecified: Secondary | ICD-10-CM | POA: Diagnosis not present

## 2020-06-13 DIAGNOSIS — I119 Hypertensive heart disease without heart failure: Secondary | ICD-10-CM | POA: Diagnosis not present

## 2020-06-13 DIAGNOSIS — E1151 Type 2 diabetes mellitus with diabetic peripheral angiopathy without gangrene: Secondary | ICD-10-CM | POA: Diagnosis not present

## 2020-06-13 DIAGNOSIS — N529 Male erectile dysfunction, unspecified: Secondary | ICD-10-CM | POA: Diagnosis not present

## 2020-07-14 ENCOUNTER — Encounter (HOSPITAL_BASED_OUTPATIENT_CLINIC_OR_DEPARTMENT_OTHER): Payer: Self-pay | Admitting: Emergency Medicine

## 2020-07-14 ENCOUNTER — Emergency Department (HOSPITAL_BASED_OUTPATIENT_CLINIC_OR_DEPARTMENT_OTHER)
Admission: EM | Admit: 2020-07-14 | Discharge: 2020-07-14 | Disposition: A | Discharge status: Home / Self Care | Payer: Self-pay | Attending: Emergency Medicine | Admitting: Emergency Medicine

## 2020-07-14 ENCOUNTER — Other Ambulatory Visit: Payer: Self-pay

## 2020-07-14 DIAGNOSIS — I10 Essential (primary) hypertension: Secondary | ICD-10-CM | POA: Insufficient documentation

## 2020-07-14 DIAGNOSIS — W19XXXA Unspecified fall, initial encounter: Secondary | ICD-10-CM

## 2020-07-14 DIAGNOSIS — E119 Type 2 diabetes mellitus without complications: Secondary | ICD-10-CM | POA: Insufficient documentation

## 2020-07-14 DIAGNOSIS — Z23 Encounter for immunization: Secondary | ICD-10-CM | POA: Insufficient documentation

## 2020-07-14 DIAGNOSIS — W101XXA Fall (on)(from) sidewalk curb, initial encounter: Secondary | ICD-10-CM | POA: Insufficient documentation

## 2020-07-14 DIAGNOSIS — S01511A Laceration without foreign body of lip, initial encounter: Secondary | ICD-10-CM

## 2020-07-14 DIAGNOSIS — Z7984 Long term (current) use of oral hypoglycemic drugs: Secondary | ICD-10-CM | POA: Insufficient documentation

## 2020-07-14 DIAGNOSIS — Z79899 Other long term (current) drug therapy: Secondary | ICD-10-CM | POA: Insufficient documentation

## 2020-07-14 DIAGNOSIS — S60511A Abrasion of right hand, initial encounter: Secondary | ICD-10-CM

## 2020-07-14 MED ORDER — LIDOCAINE-EPINEPHRINE 2 %-1:100000 IJ SOLN: 20.0000 mL | Freq: Once | INTRAMUSCULAR | Status: DC

## 2020-07-14 MED ORDER — TETANUS-DIPHTH-ACELL PERTUSSIS 5-2.5-18.5 LF-MCG/0.5 IM SUSY
0.5000 mL | PREFILLED_SYRINGE | Freq: Once | INTRAMUSCULAR | Status: AC
  Administered 2020-07-14: 0.5 mL via INTRAMUSCULAR
  Filled 2020-07-14: qty 0.5

## 2020-07-14 MED ORDER — LIDOCAINE-EPINEPHRINE (PF) 2 %-1:200000 IJ SOLN
INTRAMUSCULAR | Status: AC
  Administered 2020-07-14: 20 mL
  Filled 2020-07-14: qty 20

## 2020-07-14 NOTE — ED Provider Notes (Signed)
[facility] EMERGENCY DEPARTMENT Provider Note  CSN: [id number] Arrival date & time: 07/14/20 0136    History Chief Complaint  Patient presents with  . Fall    HPI  Samuel Atkins is a 64 y.o. male reports he slipped and fell at the club earlier this evening, where he had been drinking. He sustained a laceration to his lower lip, denies any LOC. No other injuries noted. Complaining of moderate aching pain to lip.    Past Medical History:  Diagnosis Date  . Diabetes mellitus without complication ([place])   . Hypertension     History reviewed. No pertinent surgical history.  No family history on file.  Social History   Tobacco Use  . Smoking status: Never Smoker  . Smokeless tobacco: Never Used  Substance Use Topics  . Alcohol use: Yes    Comment: socially  . Drug use: Not Currently     Home Medications Prior to Admission medications   Medication Sig Start Date End Date Taking? Authorizing Provider  amLODipine-benazepril (LOTREL) 5-40 MG capsule Take 1 capsule by mouth daily. 12/13/19 01/12/20  Volanda Napoleon, PA-C  carvedilol (COREG) 6.25 MG tablet Take 1 tablet (6.25 mg total) by mouth 2 (two) times daily. 12/13/19 01/12/20  Volanda Napoleon, PA-C  glimepiride (AMARYL) 4 MG tablet  09/07/17   [provider]  hydrALAZINE (APRESOLINE) 100 MG tablet Take 1 tablet (100 mg total) by mouth 2 (two) times daily. 12/13/19 01/12/20  Volanda Napoleon, PA-C  HYDROcodone-acetaminophen (NORCO/VICODIN) 5-325 MG tablet Take 1-2 tablets by mouth every 6 (six) hours as needed. 12/13/19   Volanda Napoleon, PA-C  metFORMIN (GLUCOPHAGE) 1000 MG tablet Take 1,000 mg by mouth 2 (two) times daily with a meal.    [provider]  sitaGLIPtin (JANUVIA) 50 MG tablet Take 50 mg by mouth daily.    [provider]  solifenacin (VESICARE) 5 MG tablet Take by mouth.    [provider]  tolterodine (DETROL LA) 4 MG 24 hr capsule  10/15/13   [provider]     Allergies    Patient has no known allergies.   Review of Systems   Review of Systems A comprehensive review of systems was completed and negative except as noted in HPI.    Physical Exam Ht 5\' 8"  (1.727 m)   Wt 106.6 kg   BMI 35.73 kg/m   Physical Exam Vitals and nursing note reviewed.  Constitutional:      Appearance: Normal appearance.  HENT:     Head: Normocephalic.     Nose: Nose normal.     Mouth/Throat:     Mouth: Mucous membranes are moist.     Comments: 3cm stellate laceration to intraoral lip, does not cross the vermilion border but is gaping Eyes:     Extraocular Movements: Extraocular movements intact.     Conjunctiva/sclera: Conjunctivae normal.  Cardiovascular:     Rate and Rhythm: Normal rate.  Pulmonary:     Effort: Pulmonary effort is normal.     Breath sounds: Normal breath sounds.  Abdominal:     General: Abdomen is flat.     Palpations: Abdomen is soft.     Tenderness: There is no abdominal tenderness.  Musculoskeletal:        General: No swelling. Normal range of motion.     Cervical back: Neck supple.     Comments: Superficial abrasion palm of R hand  Skin:    General: Skin  is warm and dry.     Findings: No rash.  Neurological:     General: No focal deficit present.     Mental Status: He is alert.  Psychiatric:        Mood and Affect: Mood normal.      ED Results / Procedures / Treatments   Labs (all labs ordered are listed, but only abnormal results are displayed) Labs Reviewed - No data to display  EKG None   Radiology No results found.  Procedures .Marland KitchenLaceration Repair  Date/Time: 07/14/2020 3:13 AM Performed by: Truddie Hidden, MD Authorized by: Truddie Hidden, MD   Consent:    Consent obtained:  Verbal   Consent given by:  Patient   Risks discussed:  Infection, pain, poor cosmetic result and poor wound healing Universal protocol:    Patient identity confirmed:  Verbally with  patient Anesthesia:    Anesthesia method:  Local infiltration   Local anesthetic:  Lidocaine 2% WITH epi Laceration details:    Location:  Mouth   Mouth location: lower lip.   Length (cm):  4 Pre-procedure details:    Preparation:  Patient was prepped and draped in usual sterile fashion Exploration:    Hemostasis achieved with:  Epinephrine   Wound exploration: wound explored through full range of motion and entire depth of wound visualized     Contaminated: no   Treatment:    Area cleansed with:  Saline   Amount of cleaning:  Standard   Irrigation solution:  Sterile saline   Irrigation method:  Syringe Skin repair:    Repair method:  Sutures   Suture size:  5-0   Suture material:  Fast-absorbing gut   Suture technique:  Simple interrupted   Number of sutures:  6 Approximation:    Approximation:  Close Repair type:    Repair type:  Simple Post-procedure details:    Dressing:  Open (no dressing)   Procedure completion:  Tolerated well, no immediate complications    Medications Ordered in the ED Medications  lidocaine-EPINEPHrine (XYLOCAINE W/EPI) 2 %-1:100000 (with pres) injection 20 mL (20 mLs Infiltration Not Given 07/14/20 0247)  Tdap (BOOSTRIX) injection 0.5 mL (0.5 mLs Intramuscular Given 07/14/20 0242)  lidocaine-EPINEPHrine (XYLOCAINE W/EPI) 2 %-1:200000 (PF) injection (20 mLs  Given 07/14/20 0245)     MDM Rules/Calculators/A&P MDM  ED Course  I have reviewed the triage vital signs and the nursing notes.  Pertinent labs & imaging results that were available during my care of the patient were reviewed by me and considered in my medical decision making (see chart for details).  Clinical Course as of 07/14/20 0316  Sun Jul 14, 2020  [id number] Stellate/T-shaped intraoral wound repaired with Vicryl Rapide. Small central tissue defect remains. Patient given instructions on keeping this area clean. TDAP updated. Patient eager to go home. Wife at bedside to drive him.  [CS]     Clinical Course User Index [CS] Truddie Hidden, MD    Final Clinical Impression(s) / ED Diagnoses Final diagnoses:  Fall, initial encounter  Lip laceration, initial encounter  Abrasion, hand, right, initial encounter    Rx / DC Orders ED Discharge Orders    None       Truddie Hidden, MD 07/14/20 [phone]

## 2020-07-14 NOTE — ED Triage Notes (Signed)
Pt states he fell on a curb ~ 0125. Lac to middle of lower lip. Not bleeding on arrival. Pt unsure of his last tetanus shot date. Denies LOC or other injury.

## 2020-08-07 DIAGNOSIS — I10 Essential (primary) hypertension: Secondary | ICD-10-CM | POA: Diagnosis not present

## 2020-08-07 DIAGNOSIS — D649 Anemia, unspecified: Secondary | ICD-10-CM | POA: Diagnosis not present

## 2020-08-07 DIAGNOSIS — Z1329 Encounter for screening for other suspected endocrine disorder: Secondary | ICD-10-CM | POA: Diagnosis not present

## 2020-08-07 DIAGNOSIS — Z131 Encounter for screening for diabetes mellitus: Secondary | ICD-10-CM | POA: Diagnosis not present

## 2020-08-07 DIAGNOSIS — Z0001 Encounter for general adult medical examination with abnormal findings: Secondary | ICD-10-CM | POA: Diagnosis not present

## 2020-08-07 DIAGNOSIS — E1151 Type 2 diabetes mellitus with diabetic peripheral angiopathy without gangrene: Secondary | ICD-10-CM | POA: Diagnosis not present

## 2020-08-07 DIAGNOSIS — Z125 Encounter for screening for malignant neoplasm of prostate: Secondary | ICD-10-CM | POA: Diagnosis not present

## 2020-08-07 DIAGNOSIS — R55 Syncope and collapse: Secondary | ICD-10-CM | POA: Diagnosis not present

## 2020-08-07 DIAGNOSIS — N529 Male erectile dysfunction, unspecified: Secondary | ICD-10-CM | POA: Diagnosis not present

## 2020-08-07 DIAGNOSIS — E559 Vitamin D deficiency, unspecified: Secondary | ICD-10-CM | POA: Diagnosis not present

## 2020-08-07 DIAGNOSIS — E782 Mixed hyperlipidemia: Secondary | ICD-10-CM | POA: Diagnosis not present

## 2020-08-07 DIAGNOSIS — Z136 Encounter for screening for cardiovascular disorders: Secondary | ICD-10-CM | POA: Diagnosis not present

## 2020-08-08 ENCOUNTER — Ambulatory Visit: Payer: Medicare HMO | Admitting: Cardiology

## 2020-08-08 ENCOUNTER — Other Ambulatory Visit: Payer: Self-pay

## 2020-08-08 ENCOUNTER — Encounter: Payer: Self-pay | Admitting: Cardiology

## 2020-08-08 VITALS — Temp 98.7°F | Ht 68.0 in | Wt 238.0 lb

## 2020-08-08 DIAGNOSIS — R55 Syncope and collapse: Secondary | ICD-10-CM | POA: Diagnosis not present

## 2020-08-08 DIAGNOSIS — R011 Cardiac murmur, unspecified: Secondary | ICD-10-CM

## 2020-08-08 DIAGNOSIS — R0989 Other specified symptoms and signs involving the circulatory and respiratory systems: Secondary | ICD-10-CM

## 2020-08-08 NOTE — Progress Notes (Signed)
Patient referred by Benito Mccreedy, MD for syncope  Subjective:   Samuel Atkins, male    DOB: [birth year], 65 y.o.   MRN: [id number]   Chief Complaint  Patient presents with  . Loss of Consciousness  . Coronary Artery Disease    HPI  65 y.o. African American male with hypertension, type 2 diabetes mellitus, referred for evaluation of syncope.  Patient lives in [place], owns a daycare center. He has longstanding hypertension,type 2 DM, reasonably well controlled at baseline. He has been noted to have higher blood pressure readings at medical visits. Patient had one episode of loss of consciousness a few weeks ago. This was on a warm day, patient had not had anything to eat or drink. He was at a 65 year old birthday party, and then at another gathering. He had had 2-3 alcoholic drinks. He was walking out of a gathering, is when he had sudden onset loss of consciousness without any warning signs, feel flat on his face and had some facial lacerations requiring stitches. He denies any chest pain, shortness of breath, preceding lightheadedness symptoms. He has had two other episodes of mechanical fall without loss of consciousness.   Past Medical History:  Diagnosis Date  . Diabetes mellitus without complication ([place])   . Hypertension     History reviewed. No pertinent surgical history.   Social History   Tobacco Use  Smoking Status Never Smoker  Smokeless Tobacco Never Used    Social History   Substance and Sexual Activity  Alcohol Use Yes   Comment: socially     Family History  Problem Relation Age of Onset  . Stroke Mother   . Heart failure Father   . Hypertension Father   . Diabetes Father   . Diabetes Sister   . Diabetes Sister   . Diabetes Sister      Current Outpatient Medications on File Prior to Visit  Medication Sig Dispense Refill  . amLODipine-benazepril (LOTREL) 5-40 MG capsule Take 1 capsule by mouth daily. 30 capsule 0  . carvedilol  (COREG) 6.25 MG tablet Take 1 tablet (6.25 mg total) by mouth 2 (two) times daily. 60 tablet 0  . glimepiride (AMARYL) 4 MG tablet     . hydrALAZINE (APRESOLINE) 100 MG tablet Take 1 tablet (100 mg total) by mouth 2 (two) times daily. 60 tablet 0  . HYDROcodone-acetaminophen (NORCO/VICODIN) 5-325 MG tablet Take 1-2 tablets by mouth every 6 (six) hours as needed. 8 tablet 0  . metFORMIN (GLUCOPHAGE) 1000 MG tablet Take 1,000 mg by mouth 2 (two) times daily with a meal.    . sitaGLIPtin (JANUVIA) 50 MG tablet Take 50 mg by mouth daily.    . solifenacin (VESICARE) 5 MG tablet Take by mouth.    . tolterodine (DETROL LA) 4 MG 24 hr capsule      No current facility-administered medications on file prior to visit.    Cardiovascular and other pertinent studies:  EKG 08/08/2020: Sinus rhythm 82 bpm  Nonspecific T-abnormality   Recent labs: N/A   Review of Systems  Cardiovascular: Positive for syncope. Negative for chest pain, dyspnea on exertion, leg swelling and palpitations.         Vitals:   08/08/20 1228 08/08/20 1229  Temp:    SpO2: 97% 96%   Orthostatic VS for the past 72 hrs (Last 3 readings):  Orthostatic BP Patient Position BP Location Cuff Size Orthostatic Pulse  08/08/20 1229 (!) 163/93 Standing Left Arm Large 87  08/08/20 1228 (!) 153/94 Sitting Left Arm Large 84  08/08/20 1221 (!) 153/93 Supine Left Arm Large 82      Body mass index is 36.19 kg/m. Filed Weights   08/08/20 1221  Weight: 238 lb (108 kg)     Objective:   Physical Exam Vitals and nursing note reviewed.  Constitutional:      General: He is not in acute distress. Neck:     Vascular: No JVD.  Cardiovascular:     Rate and Rhythm: Normal rate and regular rhythm.     Pulses:          Femoral pulses are on the right side with bruit and on the left side with bruit.    Heart sounds: Murmur heard.   Harsh midsystolic murmur is present with a grade of 2/6 at the upper right sternal border radiating  to the neck.   Pulmonary:     Effort: Pulmonary effort is normal.     Breath sounds: Normal breath sounds. No wheezing or rales.  Musculoskeletal:     Right lower leg: No edema.     Left lower leg: No edema.         Assessment & Recommendations:   65 y.o. African American male with hypertension, type 2 diabetes mellitus, referred for evaluation of syncope.  Syncope: No warning signs. Normal baseline EKG, aortic murmur on exam.  Known hypertension, type 2 DM. I suspect the syncope episode was likely precipitated by dehydration, alcohol intake. Recommend cardiac telemetry, echocardiogram, carotid US. Informed patient about [place] driving law of no driving 6 months after syncope. Increased hydration.   Hypertension: BP elevated, negative orthostatics. Suspect white coat hypertension. No changes made to antihypertensive therapy today.  Thank you for referring the patient to [place]. Please feel free to contact with any questions.   Nigel Mormon, MD Pager: [phone] Office: [phone]

## 2020-08-09 ENCOUNTER — Encounter: Payer: Self-pay | Admitting: Cardiology

## 2020-08-09 DIAGNOSIS — R011 Cardiac murmur, unspecified: Secondary | ICD-10-CM | POA: Insufficient documentation

## 2020-08-09 DIAGNOSIS — R55 Syncope and collapse: Secondary | ICD-10-CM | POA: Insufficient documentation

## 2020-08-09 DIAGNOSIS — R0989 Other specified symptoms and signs involving the circulatory and respiratory systems: Secondary | ICD-10-CM | POA: Insufficient documentation

## 2020-08-20 ENCOUNTER — Other Ambulatory Visit: Payer: Medicare HMO

## 2020-08-20 ENCOUNTER — Inpatient Hospital Stay: Payer: Medicare HMO

## 2020-08-22 ENCOUNTER — Inpatient Hospital Stay: Payer: Medicare HMO

## 2020-08-29 ENCOUNTER — Inpatient Hospital Stay: Payer: Medicare HMO

## 2020-08-29 ENCOUNTER — Other Ambulatory Visit: Payer: Medicare HMO

## 2020-09-05 ENCOUNTER — Ambulatory Visit: Payer: Medicare HMO | Admitting: Cardiology

## 2020-09-05 ENCOUNTER — Inpatient Hospital Stay: Payer: Medicare HMO

## 2020-09-12 ENCOUNTER — Ambulatory Visit: Payer: Medicare HMO | Admitting: Cardiology

## 2020-09-20 DIAGNOSIS — N529 Male erectile dysfunction, unspecified: Secondary | ICD-10-CM | POA: Diagnosis not present

## 2020-09-20 DIAGNOSIS — E669 Obesity, unspecified: Secondary | ICD-10-CM | POA: Diagnosis not present

## 2020-09-20 DIAGNOSIS — E782 Mixed hyperlipidemia: Secondary | ICD-10-CM | POA: Diagnosis not present

## 2020-09-20 DIAGNOSIS — N3281 Overactive bladder: Secondary | ICD-10-CM | POA: Diagnosis not present

## 2020-09-20 DIAGNOSIS — E1151 Type 2 diabetes mellitus with diabetic peripheral angiopathy without gangrene: Secondary | ICD-10-CM | POA: Diagnosis not present

## 2020-09-20 DIAGNOSIS — I10 Essential (primary) hypertension: Secondary | ICD-10-CM | POA: Diagnosis not present

## 2021-01-03 ENCOUNTER — Telehealth: Payer: Self-pay | Admitting: Cardiology

## 2021-01-03 NOTE — Telephone Encounter (Signed)
This patient has had a request to have a monitor placed that continues to be put back in the request workqueue. Patient has had multiple no shows and cancellations on the appointment to place the monitor. I am going to remove the request from the chart.

## 2021-01-03 NOTE — Telephone Encounter (Signed)
Okay with me if okay with AD.  Thanks MJP

## 2021-02-03 DIAGNOSIS — I10 Essential (primary) hypertension: Secondary | ICD-10-CM | POA: Diagnosis not present

## 2021-02-03 DIAGNOSIS — E782 Mixed hyperlipidemia: Secondary | ICD-10-CM | POA: Diagnosis not present

## 2021-02-03 DIAGNOSIS — D649 Anemia, unspecified: Secondary | ICD-10-CM | POA: Diagnosis not present

## 2021-02-03 DIAGNOSIS — E1151 Type 2 diabetes mellitus with diabetic peripheral angiopathy without gangrene: Secondary | ICD-10-CM | POA: Diagnosis not present

## 2021-02-10 DIAGNOSIS — E782 Mixed hyperlipidemia: Secondary | ICD-10-CM | POA: Diagnosis not present

## 2021-02-10 DIAGNOSIS — N529 Male erectile dysfunction, unspecified: Secondary | ICD-10-CM | POA: Diagnosis not present

## 2021-02-10 DIAGNOSIS — E1151 Type 2 diabetes mellitus with diabetic peripheral angiopathy without gangrene: Secondary | ICD-10-CM | POA: Diagnosis not present

## 2021-02-10 DIAGNOSIS — Z0001 Encounter for general adult medical examination with abnormal findings: Secondary | ICD-10-CM | POA: Diagnosis not present

## 2021-02-10 DIAGNOSIS — I10 Essential (primary) hypertension: Secondary | ICD-10-CM | POA: Diagnosis not present

## 2021-03-06 DIAGNOSIS — N529 Male erectile dysfunction, unspecified: Secondary | ICD-10-CM | POA: Diagnosis not present

## 2021-03-06 DIAGNOSIS — E782 Mixed hyperlipidemia: Secondary | ICD-10-CM | POA: Diagnosis not present

## 2021-03-06 DIAGNOSIS — E1151 Type 2 diabetes mellitus with diabetic peripheral angiopathy without gangrene: Secondary | ICD-10-CM | POA: Diagnosis not present

## 2021-03-06 DIAGNOSIS — I10 Essential (primary) hypertension: Secondary | ICD-10-CM | POA: Diagnosis not present

## 2021-06-06 DIAGNOSIS — E1151 Type 2 diabetes mellitus with diabetic peripheral angiopathy without gangrene: Secondary | ICD-10-CM | POA: Diagnosis not present

## 2021-06-06 DIAGNOSIS — I10 Essential (primary) hypertension: Secondary | ICD-10-CM | POA: Diagnosis not present

## 2021-06-06 DIAGNOSIS — E782 Mixed hyperlipidemia: Secondary | ICD-10-CM | POA: Diagnosis not present

## 2021-06-06 DIAGNOSIS — M25531 Pain in right wrist: Secondary | ICD-10-CM | POA: Diagnosis not present

## 2021-06-09 DIAGNOSIS — Z86010 Personal history of colonic polyps: Secondary | ICD-10-CM | POA: Diagnosis not present

## 2021-06-09 DIAGNOSIS — E1165 Type 2 diabetes mellitus with hyperglycemia: Secondary | ICD-10-CM | POA: Diagnosis not present

## 2021-06-09 DIAGNOSIS — I10 Essential (primary) hypertension: Secondary | ICD-10-CM | POA: Diagnosis not present

## 2021-06-24 DIAGNOSIS — I10 Essential (primary) hypertension: Secondary | ICD-10-CM | POA: Diagnosis not present

## 2021-06-24 DIAGNOSIS — D169 Benign neoplasm of bone and articular cartilage, unspecified: Secondary | ICD-10-CM | POA: Diagnosis not present

## 2021-06-24 DIAGNOSIS — M19041 Primary osteoarthritis, right hand: Secondary | ICD-10-CM | POA: Diagnosis not present

## 2021-06-24 DIAGNOSIS — N3281 Overactive bladder: Secondary | ICD-10-CM | POA: Diagnosis not present

## 2021-06-24 DIAGNOSIS — E782 Mixed hyperlipidemia: Secondary | ICD-10-CM | POA: Diagnosis not present

## 2021-06-24 DIAGNOSIS — N1831 Chronic kidney disease, stage 3a: Secondary | ICD-10-CM | POA: Diagnosis not present

## 2021-06-24 DIAGNOSIS — E1151 Type 2 diabetes mellitus with diabetic peripheral angiopathy without gangrene: Secondary | ICD-10-CM | POA: Diagnosis not present

## 2021-06-24 DIAGNOSIS — N528 Other male erectile dysfunction: Secondary | ICD-10-CM | POA: Diagnosis not present

## 2021-07-01 ENCOUNTER — Ambulatory Visit (INDEPENDENT_AMBULATORY_CARE_PROVIDER_SITE_OTHER): Payer: Medicare Other | Admitting: Family Medicine

## 2021-07-01 ENCOUNTER — Ambulatory Visit: Payer: Self-pay

## 2021-07-01 ENCOUNTER — Encounter: Payer: Self-pay | Admitting: Family Medicine

## 2021-07-01 ENCOUNTER — Other Ambulatory Visit: Payer: Self-pay

## 2021-07-01 ENCOUNTER — Ambulatory Visit (HOSPITAL_BASED_OUTPATIENT_CLINIC_OR_DEPARTMENT_OTHER)
Admission: RE | Admit: 2021-07-01 | Discharge: 2021-07-01 | Disposition: A | Discharge status: Home / Self Care | Payer: Medicare Other | Source: Ambulatory Visit | Attending: Family Medicine | Admitting: Family Medicine

## 2021-07-01 VITALS — BP 130/84 | Ht 68.0 in | Wt 238.0 lb

## 2021-07-01 DIAGNOSIS — M25531 Pain in right wrist: Secondary | ICD-10-CM

## 2021-07-01 DIAGNOSIS — M9689 Other intraoperative and postprocedural complications and disorders of the musculoskeletal system: Secondary | ICD-10-CM | POA: Insufficient documentation

## 2021-07-01 MED ORDER — MELOXICAM 7.5 MG PO TABS: 7.5000 mg | ORAL_TABLET | Freq: Every day | ORAL | 1 refills | Status: DC

## 2021-07-01 NOTE — Assessment & Plan Note (Signed)
Acute on chronic in nature.  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  He has had ongoing pain since that time and has acutely gotten worse in the past 2 years.  He has limited supination and pronation as well as flexion extension of the wrist.  Concern for instability given his lack of motion and pain exacerbated with exam today. ?-Counseled on home exercise therapy and supportive care. ?-X-ray. ?-Meloxicam. ?-Would consider further imaging given the duration of symptoms as well as for presurgical planning. ?

## 2021-07-01 NOTE — Progress Notes (Signed)
?  Samuel Atkins - 66 y.o. male MRN [id number]  Date of birth: 04/30/1955 ? ?SUBJECTIVE:  Including CC & ROS.  ?No chief complaint on file. ? ? ?Samuel Atkins is a 66 y.o. male that is presenting with acute on chronic right wrist pain.  The pain has been ongoing for several years.  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  Since that time he has had ongoing pain in the wrist but has gotten worse over the past 2 years.  He has dealt with the pain.  He has limited flexion and extension of the wrist.  He has limited grip strength as well as abduction of the thumb.. ? ? ?Review of Systems ?See HPI  ? ?HISTORY: Past Medical, Surgical, Social, and Family History Reviewed & Updated per EMR.   ?Pertinent Historical Findings include: ? ?Past Medical History:  ?Diagnosis Date  ? Diabetes mellitus without complication ([place])   ? Hypertension   ? ? ?History reviewed. No pertinent surgical history. ? ? ?PHYSICAL EXAM:  ?VS: BP 130/84 (BP Location: Left Arm, Patient Position: Sitting)   Ht '5\' 8"'$  (1.727 m)   Wt 238 lb (108 kg)   BMI 36.19 kg/m?  ?Physical Exam ?Gen: NAD, alert, cooperative with exam, well-appearing ?MSK:  ?Neurovascularly intact   ? ?Limited ultrasound: Right wrist: ? ?No changes at the intersection. ?Normal-appearing first dorsal compartment. ?There appears to be a defect in the distal radius. ?Effusion within the wrist joints  ? ?Summary: Effusion of the wrist joint ? ?Ultrasound and interpretation by Clearance Coots, MD ? ? ? ?ASSESSMENT & PLAN:  ? 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  He has had ongoing pain since that time and has acutely gotten worse in the past 2 years.  He has limited supination and pronation as well as flexion extension of the wrist.  Concern for instability given his lack of motion and pain exacerbated with exam today. ?-Counseled on home  exercise therapy and supportive care. ?-X-ray. ?-Meloxicam. ?-Would consider further imaging given the duration of symptoms as well as for presurgical planning. ? ? ? ? ?

## 2021-07-01 NOTE — Patient Instructions (Signed)
Good to see you ?Please use ice needed  ?Please use the mobic as needed  ?I will call with the xrays from today.  ?Please try to get a MRI arthrogram of the wrist at the [facility]  ?Please send me a message in MyChart with any questions or updates.  ?Please see me back after the MRI is completed.  ? ?--Dr. Raeford Razor ? ?

## 2021-07-03 ENCOUNTER — Telehealth: Payer: Self-pay | Admitting: Family Medicine

## 2021-07-03 DIAGNOSIS — M9689 Other intraoperative and postprocedural complications and disorders of the musculoskeletal system: Secondary | ICD-10-CM

## 2021-07-03 NOTE — Telephone Encounter (Signed)
Left VM for patient. If he calls back please have him speak with a nurse/CMA and inform that his xray is showing a possible ligament tear. This is what the mri would help with.  ? ?If any questions then please take the best time and phone number to call and I will try to call him back.  ? ?Rosemarie Ax, MD ?[facility] ?07/03/2021, 8:54 AM ? ? ?

## 2021-07-03 NOTE — Telephone Encounter (Signed)
Pt informed of below. He is requesting a order sent to the [facility] in [place].  ?

## 2021-07-04 NOTE — Telephone Encounter (Signed)
MRA right wrist order printed, signed and faxed to [facility] imaging @ [phone]. ?

## 2021-07-15 DIAGNOSIS — M19041 Primary osteoarthritis, right hand: Secondary | ICD-10-CM | POA: Diagnosis not present

## 2021-07-15 DIAGNOSIS — E782 Mixed hyperlipidemia: Secondary | ICD-10-CM | POA: Diagnosis not present

## 2021-07-15 DIAGNOSIS — N3281 Overactive bladder: Secondary | ICD-10-CM | POA: Diagnosis not present

## 2021-07-15 DIAGNOSIS — E1151 Type 2 diabetes mellitus with diabetic peripheral angiopathy without gangrene: Secondary | ICD-10-CM | POA: Diagnosis not present

## 2021-07-15 DIAGNOSIS — I10 Essential (primary) hypertension: Secondary | ICD-10-CM | POA: Diagnosis not present

## 2021-07-17 DIAGNOSIS — K635 Polyp of colon: Secondary | ICD-10-CM | POA: Diagnosis not present

## 2021-07-17 DIAGNOSIS — Z86010 Personal history of colonic polyps: Secondary | ICD-10-CM | POA: Diagnosis not present

## 2021-07-22 DIAGNOSIS — E1165 Type 2 diabetes mellitus with hyperglycemia: Secondary | ICD-10-CM | POA: Diagnosis not present

## 2021-07-22 DIAGNOSIS — M19079 Primary osteoarthritis, unspecified ankle and foot: Secondary | ICD-10-CM | POA: Diagnosis not present

## 2021-07-22 DIAGNOSIS — M79671 Pain in right foot: Secondary | ICD-10-CM | POA: Diagnosis not present

## 2021-07-22 DIAGNOSIS — E1100 Type 2 diabetes mellitus with hyperosmolarity without nonketotic hyperglycemic-hyperosmolar coma (NKHHC): Secondary | ICD-10-CM | POA: Diagnosis not present

## 2021-08-12 DIAGNOSIS — H5213 Myopia, bilateral: Secondary | ICD-10-CM | POA: Diagnosis not present

## 2021-08-12 DIAGNOSIS — H524 Presbyopia: Secondary | ICD-10-CM | POA: Diagnosis not present

## 2021-08-12 DIAGNOSIS — H109 Unspecified conjunctivitis: Secondary | ICD-10-CM | POA: Diagnosis not present

## 2021-08-12 DIAGNOSIS — E119 Type 2 diabetes mellitus without complications: Secondary | ICD-10-CM | POA: Diagnosis not present

## 2021-08-20 DIAGNOSIS — H40023 Open angle with borderline findings, high risk, bilateral: Secondary | ICD-10-CM | POA: Diagnosis not present

## 2021-08-20 DIAGNOSIS — E119 Type 2 diabetes mellitus without complications: Secondary | ICD-10-CM | POA: Diagnosis not present

## 2021-08-20 DIAGNOSIS — H5213 Myopia, bilateral: Secondary | ICD-10-CM | POA: Diagnosis not present

## 2021-08-20 DIAGNOSIS — H524 Presbyopia: Secondary | ICD-10-CM | POA: Diagnosis not present

## 2021-09-01 ENCOUNTER — Emergency Department (HOSPITAL_BASED_OUTPATIENT_CLINIC_OR_DEPARTMENT_OTHER)
Admission: EM | Admit: 2021-09-01 | Discharge: 2021-09-01 | Disposition: A | Discharge status: Home / Self Care | Payer: Medicare Other | Attending: Emergency Medicine | Admitting: Emergency Medicine

## 2021-09-01 ENCOUNTER — Encounter (HOSPITAL_BASED_OUTPATIENT_CLINIC_OR_DEPARTMENT_OTHER): Payer: Self-pay | Admitting: Emergency Medicine

## 2021-09-01 ENCOUNTER — Other Ambulatory Visit: Payer: Self-pay

## 2021-09-01 DIAGNOSIS — H9201 Otalgia, right ear: Secondary | ICD-10-CM | POA: Diagnosis present

## 2021-09-01 DIAGNOSIS — Z7984 Long term (current) use of oral hypoglycemic drugs: Secondary | ICD-10-CM | POA: Insufficient documentation

## 2021-09-01 DIAGNOSIS — H6501 Acute serous otitis media, right ear: Secondary | ICD-10-CM | POA: Diagnosis not present

## 2021-09-01 MED ORDER — BENZONATATE 100 MG PO CAPS: 100.0000 mg | ORAL_CAPSULE | Freq: Three times a day (TID) | ORAL | 0 refills | Status: DC

## 2021-09-01 MED ORDER — AMOXICILLIN 500 MG PO CAPS: 1000.0000 mg | ORAL_CAPSULE | Freq: Two times a day (BID) | ORAL | 0 refills | Status: DC

## 2021-09-01 MED ORDER — AMOXICILLIN 500 MG PO CAPS
1000.0000 mg | ORAL_CAPSULE | Freq: Once | ORAL | Status: AC
  Administered 2021-09-01: 1000 mg via ORAL
  Filled 2021-09-01: qty 2

## 2021-09-01 NOTE — Discharge Instructions (Signed)
Take tylenol 2 pills 4 times a day and motrin 4 pills 3 times a day.  Drink plenty of fluids.  Return for worsening shortness of breath, headache, confusion. Follow up with your family doctor.   

## 2021-09-01 NOTE — ED Triage Notes (Signed)
Patient reports right ear pain worsening throughout the day. Also c/o sneezing and coughing since yesterday.

## 2021-09-01 NOTE — ED Provider Notes (Signed)
[facility] EMERGENCY DEPARTMENT Provider Note   CSN: [id number] Arrival date & time: 09/01/21  1334     History  Chief Complaint  Patient presents with   Otalgia    Samuel Atkins is a 66 y.o. male.  65 yo M with a cc of cough congestion and right ear pain.  This been going on for a couple days now.  He thinks is because he was standing out in the rain.  He denies any fevers or chills.  Has been able to eat and drink without issue.  Denies difficulty breathing.   Otalgia     Home Medications Prior to Admission medications   Medication Sig Start Date End Date Taking? Authorizing Provider  amoxicillin (AMOXIL) 500 MG capsule Take 2 capsules (1,000 mg total) by mouth 2 (two) times daily. 09/01/21  Yes Deno Etienne, DO  benzonatate (TESSALON) 100 MG capsule Take 1 capsule (100 mg total) by mouth every 8 (eight) hours. 09/01/21  Yes Deno Etienne, DO  amLODipine-benazepril (LOTREL) 5-40 MG capsule Take 1 capsule by mouth daily. 12/13/19 01/12/20  Volanda Napoleon, PA-C  carvedilol (COREG) 6.25 MG tablet Take 1 tablet (6.25 mg total) by mouth 2 (two) times daily. 12/13/19 01/12/20  Volanda Napoleon, PA-C  glimepiride (AMARYL) 4 MG tablet  09/07/17   [provider]  hydrALAZINE (APRESOLINE) 100 MG tablet Take 1 tablet (100 mg total) by mouth 2 (two) times daily. 12/13/19 01/12/20  Volanda Napoleon, PA-C  meloxicam (MOBIC) 7.5 MG tablet Take 1 tablet (7.5 mg total) by mouth daily. 07/01/21   Rosemarie Ax, MD  metFORMIN (GLUCOPHAGE) 1000 MG tablet Take 1,000 mg by mouth 2 (two) times daily with a meal.    [provider]  sitaGLIPtin (JANUVIA) 50 MG tablet Take 50 mg by mouth daily.    [provider]  tolterodine (DETROL LA) 4 MG 24 hr capsule  10/15/13   [provider]      Allergies    Patient has no known allergies.    Review of Systems   Review of Systems  HENT:  Positive for ear pain.    Physical Exam Updated Vital Signs BP (!)  173/95 (BP Location: Left Arm)   Pulse (!) 103   Temp 98 F (36.7 C) (Oral)   Resp 20   Ht '5\' 8"'$  (1.727 m)   Wt 108.9 kg   SpO2 98%   BMI 36.49 kg/m  Physical Exam Vitals and nursing note reviewed.  Constitutional:      Appearance: He is well-developed.  HENT:     Head: Normocephalic and atraumatic.     Comments: Swollen turbinates posterior nasal drip left TM with serous effusion and some bulging.  Right TM with purulent effusion erythema bulging and distortion of landmarks. Eyes:     Pupils: Pupils are equal, round, and reactive to light.  Neck:     Vascular: No JVD.  Cardiovascular:     Rate and Rhythm: Normal rate and regular rhythm.     Heart sounds: No murmur heard.   No friction rub. No gallop.  Pulmonary:     Effort: No respiratory distress.     Breath sounds: No wheezing.  Abdominal:     General: There is no distension.     Tenderness: There is no abdominal tenderness. There is no guarding or rebound.  Musculoskeletal:        General: Normal range of motion.     Cervical back: Normal range  of motion and neck supple.  Skin:    Coloration: Skin is not pale.     Findings: No rash.  Neurological:     Mental Status: He is alert and oriented to person, place, and time.  Psychiatric:        Behavior: Behavior normal.    ED Results / Procedures / Treatments   Labs (all labs ordered are listed, but only abnormal results are displayed) Labs Reviewed - No data to display  EKG None  Radiology No results found.  Procedures Procedures    Medications Ordered in ED Medications  amoxicillin (AMOXIL) capsule 1,000 mg (has no administration in time range)    ED Course/ Medical Decision Making/ A&P                           Medical Decision Making Risk Prescription drug management.   66 yo M with a chief complaints of right ear pain in conjunction with an upper respiratory illness.  Going on for a couple days.  On exam the patient clinically has otitis  media.  He has clear lung sounds no tachypnea no reported difficulty breathing.  I do not feel that imaging or lab work here would be beneficial.  We will start him on antibiotics.  We will have him follow-up with his family doctor in the office.  2:00 PM:  I have discussed the diagnosis/risks/treatment options with the patient.  Evaluation and diagnostic testing in the emergency department does not suggest an emergent condition requiring admission or immediate intervention beyond what has been performed at this time.  They will follow up with  PCP. We also discussed returning to the ED immediately if new or worsening sx occur. We discussed the sx which are most concerning (e.g., sudden worsening pain, fever, inability to tolerate by mouth) that necessitate immediate return. Medications administered to the patient during their visit and any new prescriptions provided to the patient are listed below.  Medications given during this visit Medications  amoxicillin (AMOXIL) capsule 1,000 mg (has no administration in time range)     The patient appears reasonably screen and/or stabilized for discharge and I doubt any other medical condition or other [facility] requiring further screening, evaluation, or treatment in the ED at this time prior to discharge.          Final Clinical Impression(s) / ED Diagnoses Final diagnoses:  Right acute serous otitis media, recurrence not specified    Rx / DC Orders ED Discharge Orders          Ordered    amoxicillin (AMOXIL) 500 MG capsule  2 times daily        09/01/21 1356    benzonatate (TESSALON) 100 MG capsule  Every 8 hours        09/01/21 1356              Deno Etienne, DO 09/01/21 1400

## 2021-09-08 DIAGNOSIS — H9201 Otalgia, right ear: Secondary | ICD-10-CM | POA: Diagnosis not present

## 2021-09-08 DIAGNOSIS — E1151 Type 2 diabetes mellitus with diabetic peripheral angiopathy without gangrene: Secondary | ICD-10-CM | POA: Diagnosis not present

## 2021-09-08 DIAGNOSIS — M19041 Primary osteoarthritis, right hand: Secondary | ICD-10-CM | POA: Diagnosis not present

## 2021-09-08 DIAGNOSIS — I10 Essential (primary) hypertension: Secondary | ICD-10-CM | POA: Diagnosis not present

## 2021-09-08 DIAGNOSIS — E782 Mixed hyperlipidemia: Secondary | ICD-10-CM | POA: Diagnosis not present

## 2021-10-08 DIAGNOSIS — I10 Essential (primary) hypertension: Secondary | ICD-10-CM | POA: Diagnosis not present

## 2021-10-08 DIAGNOSIS — E1151 Type 2 diabetes mellitus with diabetic peripheral angiopathy without gangrene: Secondary | ICD-10-CM | POA: Diagnosis not present

## 2021-10-08 DIAGNOSIS — E782 Mixed hyperlipidemia: Secondary | ICD-10-CM | POA: Diagnosis not present

## 2021-10-08 DIAGNOSIS — H9201 Otalgia, right ear: Secondary | ICD-10-CM | POA: Diagnosis not present

## 2021-10-08 DIAGNOSIS — M19041 Primary osteoarthritis, right hand: Secondary | ICD-10-CM | POA: Diagnosis not present

## 2021-10-09 DIAGNOSIS — E782 Mixed hyperlipidemia: Secondary | ICD-10-CM | POA: Diagnosis not present

## 2021-10-09 DIAGNOSIS — I10 Essential (primary) hypertension: Secondary | ICD-10-CM | POA: Diagnosis not present

## 2021-10-09 DIAGNOSIS — H524 Presbyopia: Secondary | ICD-10-CM | POA: Diagnosis not present

## 2021-10-09 DIAGNOSIS — E1151 Type 2 diabetes mellitus with diabetic peripheral angiopathy without gangrene: Secondary | ICD-10-CM | POA: Diagnosis not present

## 2021-10-09 DIAGNOSIS — H5213 Myopia, bilateral: Secondary | ICD-10-CM | POA: Diagnosis not present

## 2021-10-09 DIAGNOSIS — E119 Type 2 diabetes mellitus without complications: Secondary | ICD-10-CM | POA: Diagnosis not present

## 2021-10-09 DIAGNOSIS — H40023 Open angle with borderline findings, high risk, bilateral: Secondary | ICD-10-CM | POA: Diagnosis not present

## 2021-10-09 DIAGNOSIS — M19041 Primary osteoarthritis, right hand: Secondary | ICD-10-CM | POA: Diagnosis not present

## 2021-12-19 DIAGNOSIS — Z125 Encounter for screening for malignant neoplasm of prostate: Secondary | ICD-10-CM | POA: Diagnosis not present

## 2021-12-19 DIAGNOSIS — R350 Frequency of micturition: Secondary | ICD-10-CM | POA: Diagnosis not present

## 2021-12-19 DIAGNOSIS — N529 Male erectile dysfunction, unspecified: Secondary | ICD-10-CM | POA: Diagnosis not present

## 2021-12-19 DIAGNOSIS — E1151 Type 2 diabetes mellitus with diabetic peripheral angiopathy without gangrene: Secondary | ICD-10-CM | POA: Diagnosis not present

## 2021-12-19 DIAGNOSIS — N401 Enlarged prostate with lower urinary tract symptoms: Secondary | ICD-10-CM | POA: Diagnosis not present

## 2021-12-19 DIAGNOSIS — M19041 Primary osteoarthritis, right hand: Secondary | ICD-10-CM | POA: Diagnosis not present

## 2021-12-19 DIAGNOSIS — E782 Mixed hyperlipidemia: Secondary | ICD-10-CM | POA: Diagnosis not present

## 2021-12-19 DIAGNOSIS — I10 Essential (primary) hypertension: Secondary | ICD-10-CM | POA: Diagnosis not present

## 2021-12-19 DIAGNOSIS — R600 Localized edema: Secondary | ICD-10-CM | POA: Diagnosis not present

## 2021-12-19 DIAGNOSIS — N138 Other obstructive and reflux uropathy: Secondary | ICD-10-CM | POA: Diagnosis not present

## 2022-01-02 DIAGNOSIS — Z23 Encounter for immunization: Secondary | ICD-10-CM | POA: Diagnosis not present

## 2022-01-02 DIAGNOSIS — E1151 Type 2 diabetes mellitus with diabetic peripheral angiopathy without gangrene: Secondary | ICD-10-CM | POA: Diagnosis not present

## 2022-01-02 DIAGNOSIS — I10 Essential (primary) hypertension: Secondary | ICD-10-CM | POA: Diagnosis not present

## 2022-02-11 DIAGNOSIS — Z0001 Encounter for general adult medical examination with abnormal findings: Secondary | ICD-10-CM | POA: Diagnosis not present

## 2022-02-11 DIAGNOSIS — I10 Essential (primary) hypertension: Secondary | ICD-10-CM | POA: Diagnosis not present

## 2022-02-18 DIAGNOSIS — I10 Essential (primary) hypertension: Secondary | ICD-10-CM | POA: Diagnosis not present

## 2022-03-12 DIAGNOSIS — I10 Essential (primary) hypertension: Secondary | ICD-10-CM | POA: Diagnosis not present

## 2022-03-25 DIAGNOSIS — N529 Male erectile dysfunction, unspecified: Secondary | ICD-10-CM | POA: Diagnosis not present

## 2022-03-25 DIAGNOSIS — Z125 Encounter for screening for malignant neoplasm of prostate: Secondary | ICD-10-CM | POA: Diagnosis not present

## 2022-03-25 DIAGNOSIS — R350 Frequency of micturition: Secondary | ICD-10-CM | POA: Diagnosis not present

## 2022-03-25 DIAGNOSIS — N401 Enlarged prostate with lower urinary tract symptoms: Secondary | ICD-10-CM | POA: Diagnosis not present

## 2022-04-16 DIAGNOSIS — H524 Presbyopia: Secondary | ICD-10-CM | POA: Diagnosis not present

## 2022-04-16 DIAGNOSIS — E119 Type 2 diabetes mellitus without complications: Secondary | ICD-10-CM | POA: Diagnosis not present

## 2022-04-16 DIAGNOSIS — H40023 Open angle with borderline findings, high risk, bilateral: Secondary | ICD-10-CM | POA: Diagnosis not present

## 2022-04-16 DIAGNOSIS — H5213 Myopia, bilateral: Secondary | ICD-10-CM | POA: Diagnosis not present

## 2022-04-16 DIAGNOSIS — H1820 Unspecified corneal edema: Secondary | ICD-10-CM | POA: Diagnosis not present

## 2022-05-07 DIAGNOSIS — N529 Male erectile dysfunction, unspecified: Secondary | ICD-10-CM | POA: Diagnosis not present

## 2022-05-07 DIAGNOSIS — N401 Enlarged prostate with lower urinary tract symptoms: Secondary | ICD-10-CM | POA: Diagnosis not present

## 2022-05-07 DIAGNOSIS — Z125 Encounter for screening for malignant neoplasm of prostate: Secondary | ICD-10-CM | POA: Diagnosis not present

## 2022-05-07 DIAGNOSIS — R350 Frequency of micturition: Secondary | ICD-10-CM | POA: Diagnosis not present

## 2022-05-07 DIAGNOSIS — N138 Other obstructive and reflux uropathy: Secondary | ICD-10-CM | POA: Diagnosis not present

## 2022-06-10 DIAGNOSIS — E782 Mixed hyperlipidemia: Secondary | ICD-10-CM | POA: Diagnosis not present

## 2022-06-10 DIAGNOSIS — I10 Essential (primary) hypertension: Secondary | ICD-10-CM | POA: Diagnosis not present

## 2022-06-10 DIAGNOSIS — E559 Vitamin D deficiency, unspecified: Secondary | ICD-10-CM | POA: Diagnosis not present

## 2022-06-10 DIAGNOSIS — E1151 Type 2 diabetes mellitus with diabetic peripheral angiopathy without gangrene: Secondary | ICD-10-CM | POA: Diagnosis not present

## 2022-06-15 DIAGNOSIS — M25512 Pain in left shoulder: Secondary | ICD-10-CM | POA: Diagnosis not present

## 2022-06-15 DIAGNOSIS — I10 Essential (primary) hypertension: Secondary | ICD-10-CM | POA: Diagnosis not present

## 2022-06-29 DIAGNOSIS — I10 Essential (primary) hypertension: Secondary | ICD-10-CM | POA: Diagnosis not present

## 2022-07-21 ENCOUNTER — Encounter: Payer: Self-pay | Admitting: *Deleted

## 2022-08-27 DIAGNOSIS — M19041 Primary osteoarthritis, right hand: Secondary | ICD-10-CM | POA: Diagnosis not present

## 2022-08-27 DIAGNOSIS — E559 Vitamin D deficiency, unspecified: Secondary | ICD-10-CM | POA: Diagnosis not present

## 2022-08-27 DIAGNOSIS — E782 Mixed hyperlipidemia: Secondary | ICD-10-CM | POA: Diagnosis not present

## 2022-08-27 DIAGNOSIS — E1129 Type 2 diabetes mellitus with other diabetic kidney complication: Secondary | ICD-10-CM | POA: Diagnosis not present

## 2022-08-27 DIAGNOSIS — E1151 Type 2 diabetes mellitus with diabetic peripheral angiopathy without gangrene: Secondary | ICD-10-CM | POA: Diagnosis not present

## 2022-08-27 DIAGNOSIS — R809 Proteinuria, unspecified: Secondary | ICD-10-CM | POA: Diagnosis not present

## 2022-08-27 DIAGNOSIS — N179 Acute kidney failure, unspecified: Secondary | ICD-10-CM | POA: Diagnosis not present

## 2022-08-27 DIAGNOSIS — I10 Essential (primary) hypertension: Secondary | ICD-10-CM | POA: Diagnosis not present

## 2022-09-08 IMAGING — DX DG WRIST COMPLETE 3+V*R*
4 series · 4 of 4 positions shown · non-contrast
Comparison: None.

CLINICAL DATA: Chronic right wrist pain status post injury many
years ago.

EXAM:
RIGHT WRIST - COMPLETE 3+ VIEW

[wrist pa]
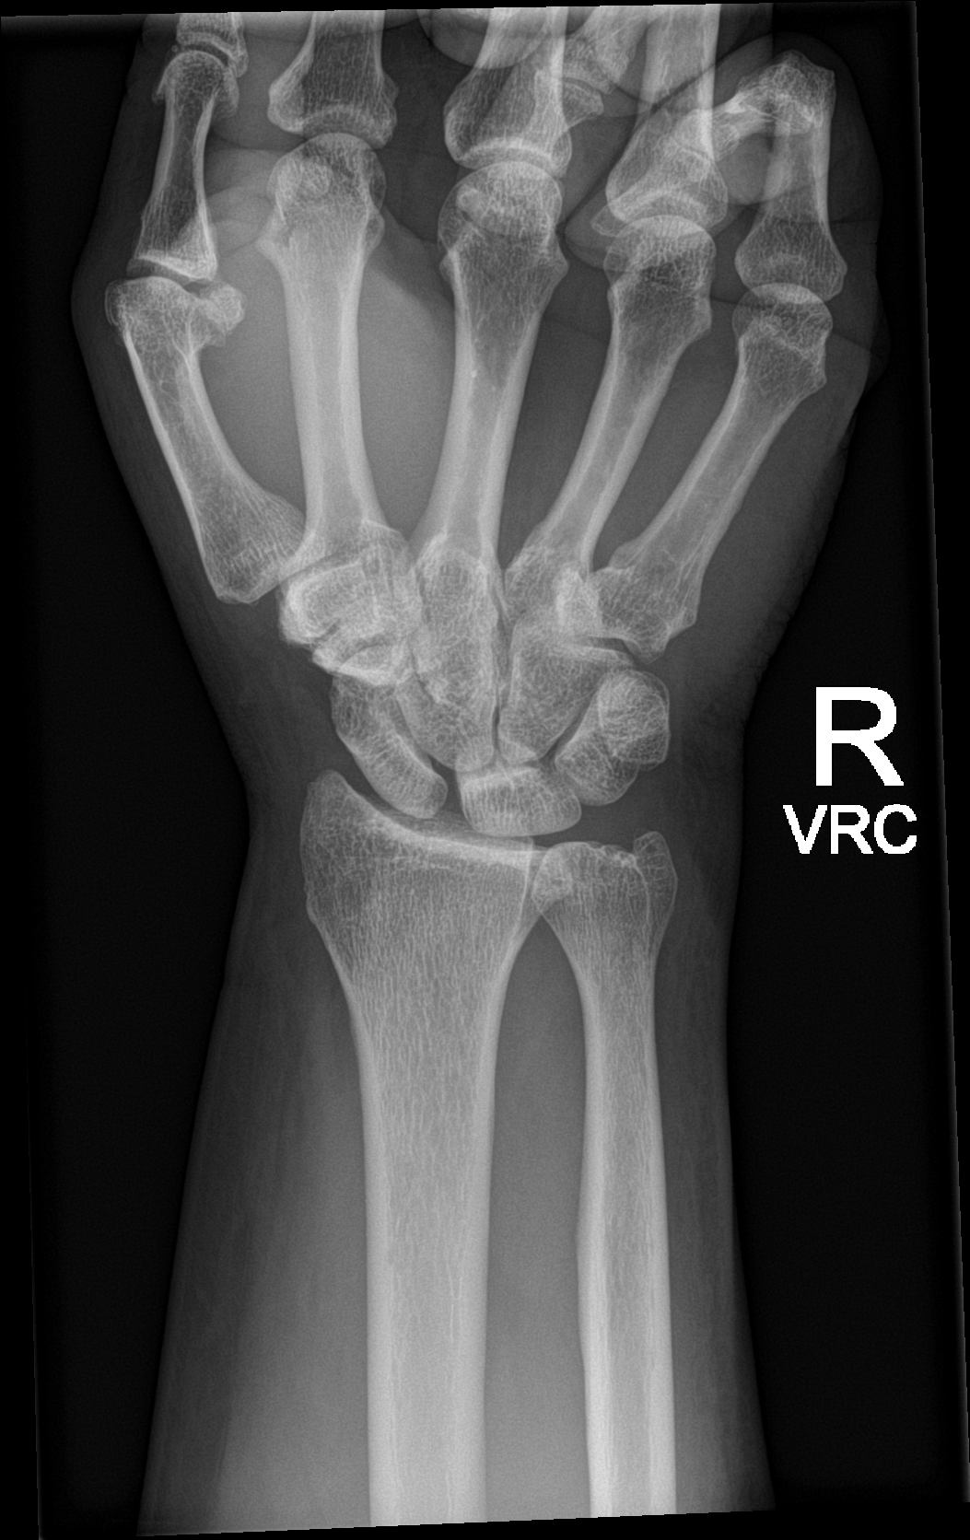

[wrist obl]
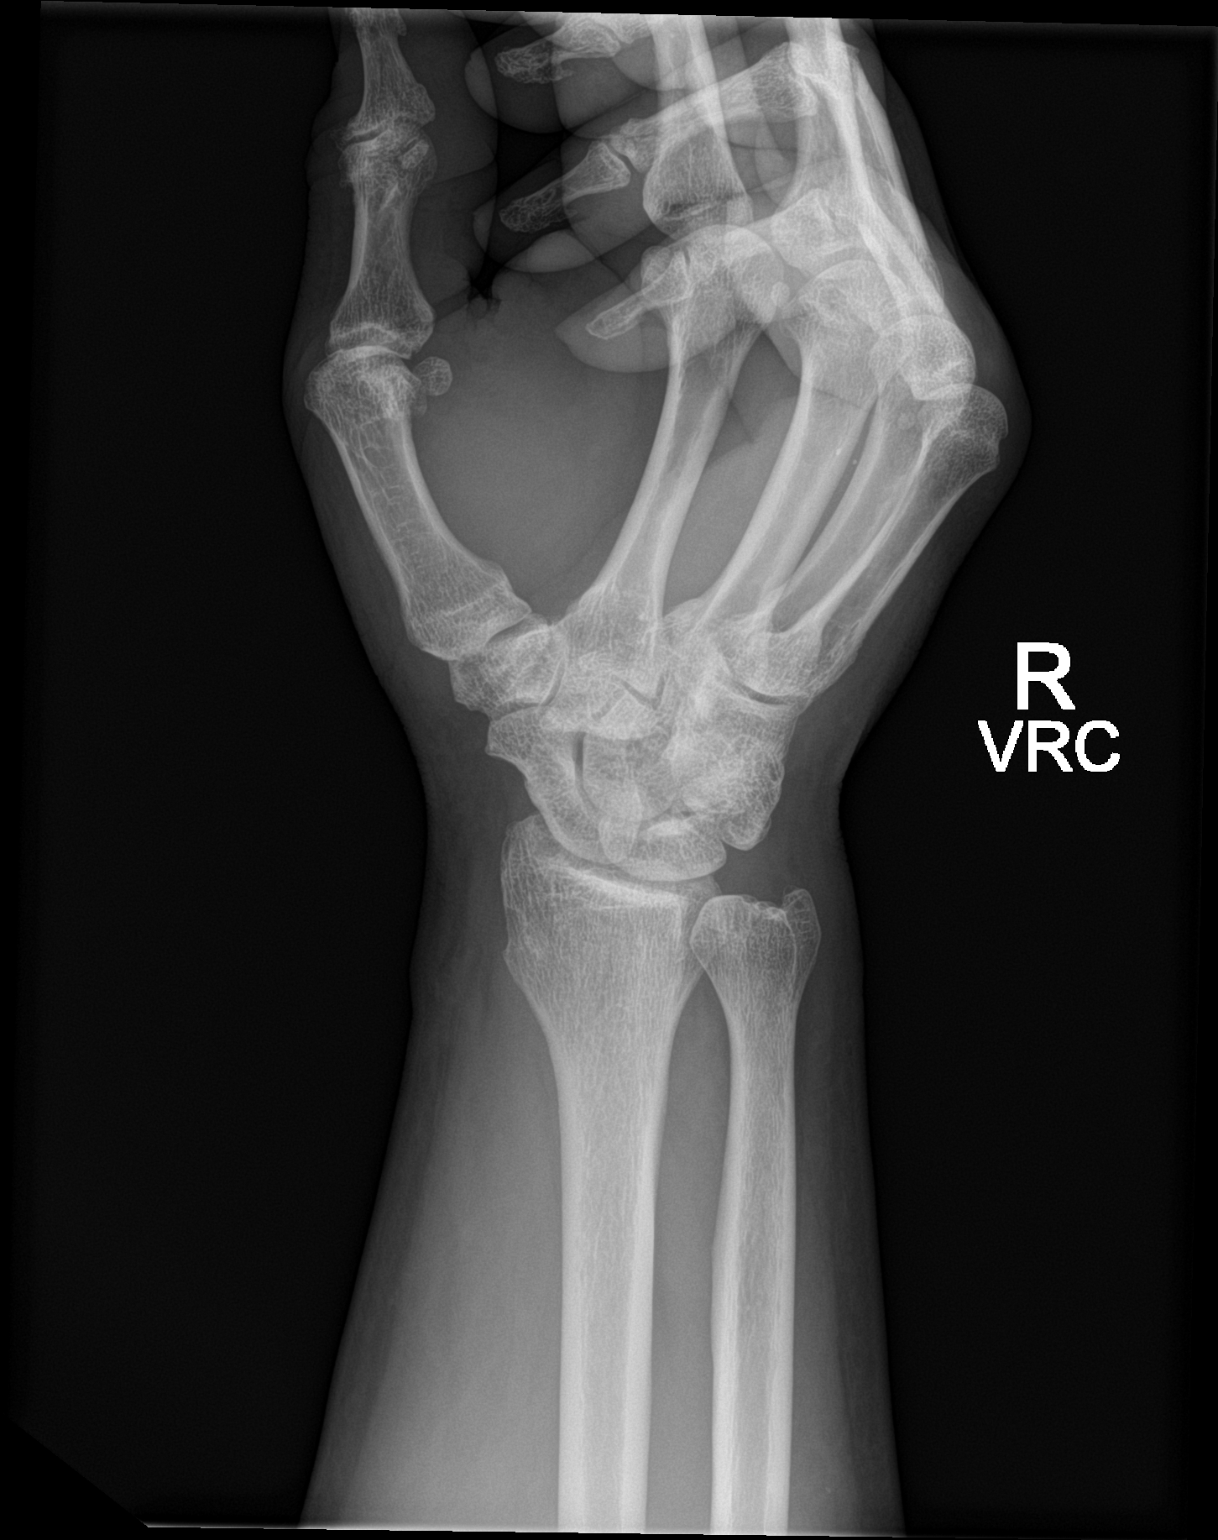

[wrist lat]
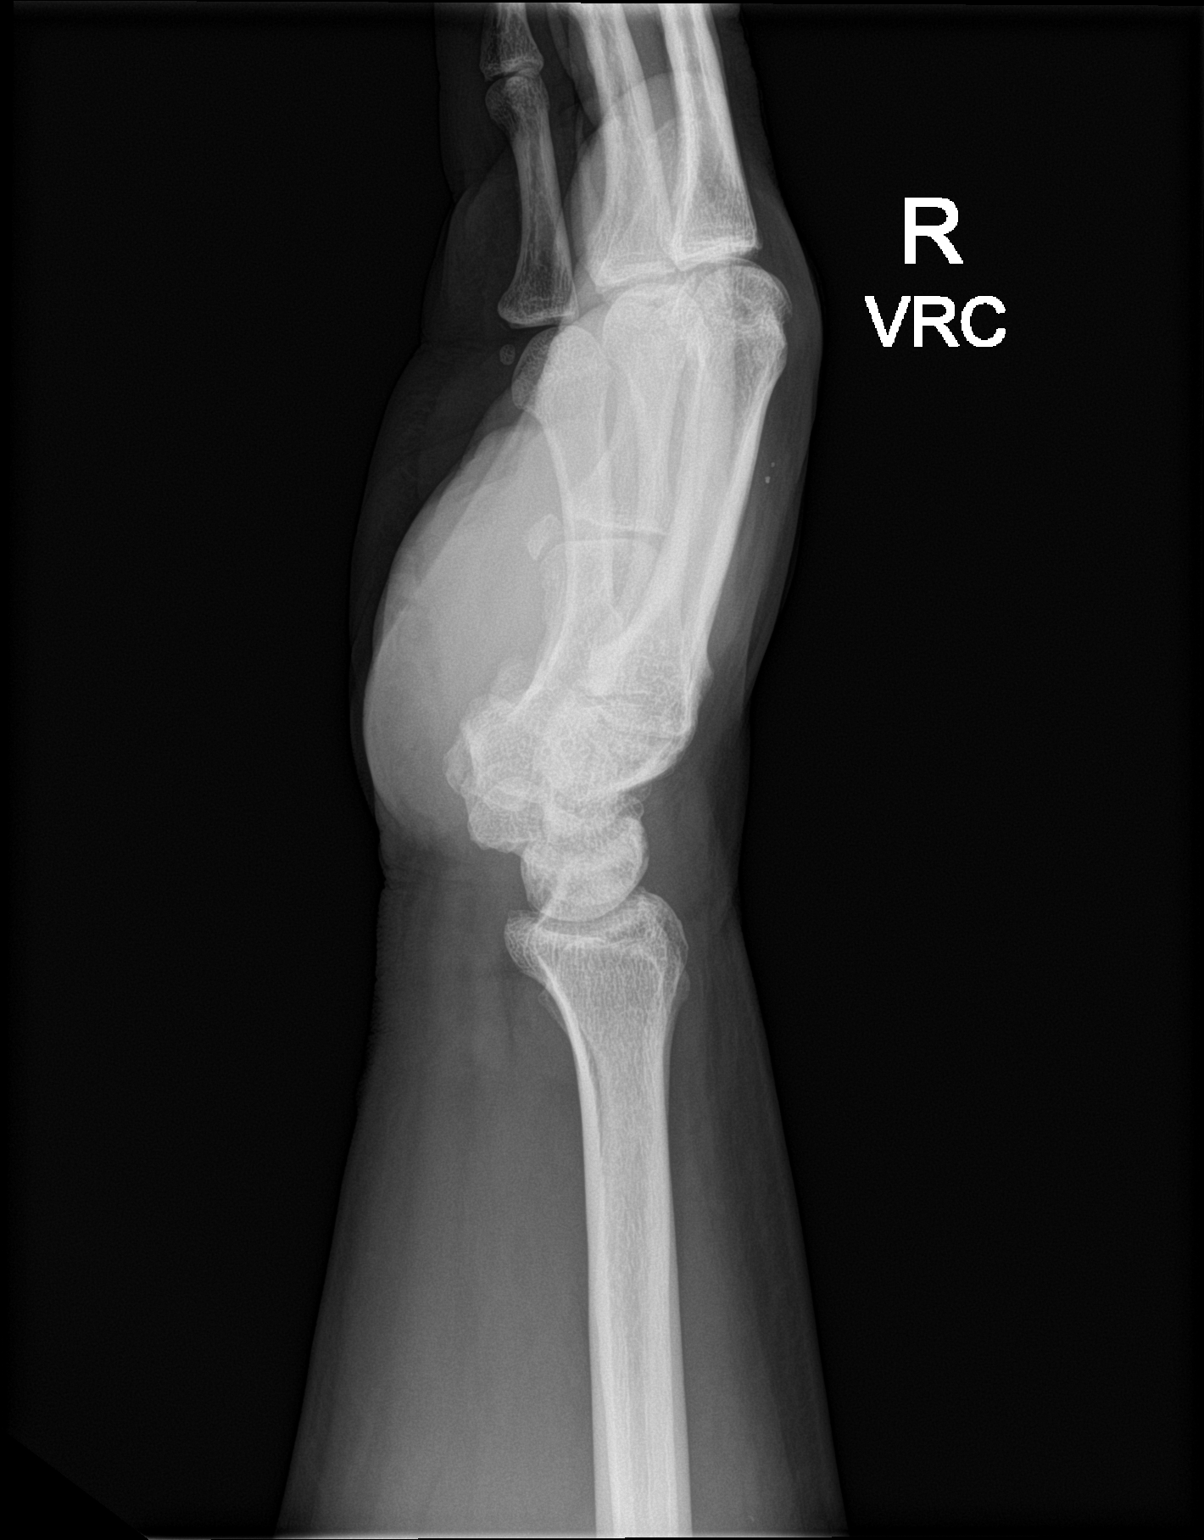

[wrist navicular]
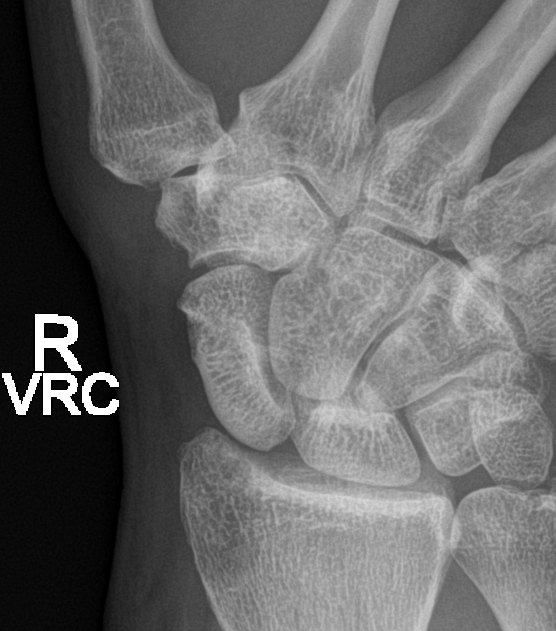

[4 of 4 positions shown; findings below may reference images not displayed]

FINDINGS: No fracture or dislocation. There is apparent widening of the
scapholunate articulation, potentially accentuated due to obliquity
though conceivably could be seen as the sequela previous ligamentous
injury. Joint spaces are preserved. No erosions. No evidence of
chondrocalcinosis. Regional soft tissues appear normal.
IMPRESSION: Apparent widening of the scapholunate articulation, potentially
accentuated due to obliquity though conceivably could be seen as the
sequela previous ligamentous injury.

## 2022-09-19 ENCOUNTER — Encounter (HOSPITAL_BASED_OUTPATIENT_CLINIC_OR_DEPARTMENT_OTHER): Payer: Self-pay | Admitting: Emergency Medicine

## 2022-09-19 ENCOUNTER — Emergency Department (HOSPITAL_BASED_OUTPATIENT_CLINIC_OR_DEPARTMENT_OTHER)
Admission: EM | Admit: 2022-09-19 | Discharge: 2022-09-19 | Disposition: A | Discharge status: Home / Self Care | Payer: Medicare Other | Attending: Emergency Medicine | Admitting: Emergency Medicine

## 2022-09-19 ENCOUNTER — Other Ambulatory Visit: Payer: Self-pay

## 2022-09-19 DIAGNOSIS — I10 Essential (primary) hypertension: Secondary | ICD-10-CM | POA: Diagnosis not present

## 2022-09-19 DIAGNOSIS — H6091 Unspecified otitis externa, right ear: Secondary | ICD-10-CM | POA: Diagnosis not present

## 2022-09-19 DIAGNOSIS — H9201 Otalgia, right ear: Secondary | ICD-10-CM | POA: Diagnosis present

## 2022-09-19 DIAGNOSIS — Z7984 Long term (current) use of oral hypoglycemic drugs: Secondary | ICD-10-CM | POA: Insufficient documentation

## 2022-09-19 DIAGNOSIS — Z79899 Other long term (current) drug therapy: Secondary | ICD-10-CM | POA: Insufficient documentation

## 2022-09-19 DIAGNOSIS — E119 Type 2 diabetes mellitus without complications: Secondary | ICD-10-CM | POA: Insufficient documentation

## 2022-09-19 DIAGNOSIS — H60501 Unspecified acute noninfective otitis externa, right ear: Secondary | ICD-10-CM | POA: Diagnosis not present

## 2022-09-19 MED ORDER — AMOXICILLIN 875 MG PO TABS: 875.0000 mg | ORAL_TABLET | Freq: Two times a day (BID) | ORAL | 0 refills | Status: AC

## 2022-09-19 MED ORDER — CIPROFLOXACIN-DEXAMETHASONE 0.3-0.1 % OT SUSP: 4.0000 [drp] | Freq: Two times a day (BID) | OTIC | 0 refills | Status: AC

## 2022-09-19 NOTE — ED Provider Notes (Signed)
[facility] EMERGENCY DEPARTMENT AT [facility] Provider Note   CSN: [id number] Arrival date & time: 09/19/22  0746     History  Chief Complaint  Patient presents with   Hearing Problem    Samuel Atkins is a 67 y.o. male.  HPI   67 year old male with medical history significant for diabetes mellitus, HTN presenting to the emergency department with ear pain and decreased hearing for the past couple of days.  He denies any fevers or chills.  He denies any significant pain or swelling behind the ear.  He states that he had a similar issue 1 year ago and symptoms cleared up with antibiotics.  Home Medications Prior to Admission medications   Medication Sig Start Date End Date Taking? Authorizing Provider  amoxicillin (AMOXIL) 875 MG tablet Take 1 tablet (875 mg total) by mouth 2 (two) times daily for 7 days. 09/19/22 09/26/22 Yes Ernie Avena, MD  ciprofloxacin-dexamethasone (CIPRODEX) OTIC suspension Place 4 drops into the right ear 2 (two) times daily for 10 days. 09/19/22 09/29/22 Yes Ernie Avena, MD  amLODipine-benazepril (LOTREL) 5-40 MG capsule Take 1 capsule by mouth daily. 12/13/19 01/12/20  Maxwell Caul, PA-C  benzonatate (TESSALON) 100 MG capsule Take 1 capsule (100 mg total) by mouth every 8 (eight) hours. 09/01/21   Melene Plan, DO  carvedilol (COREG) 6.25 MG tablet Take 1 tablet (6.25 mg total) by mouth 2 (two) times daily. 12/13/19 01/12/20  Maxwell Caul, PA-C  glimepiride (AMARYL) 4 MG tablet  09/07/17   [provider]  hydrALAZINE (APRESOLINE) 100 MG tablet Take 1 tablet (100 mg total) by mouth 2 (two) times daily. 12/13/19 01/12/20  Maxwell Caul, PA-C  meloxicam (MOBIC) 7.5 MG tablet Take 1 tablet (7.5 mg total) by mouth daily. 07/01/21   Myra Rude, MD  metFORMIN (GLUCOPHAGE) 1000 MG tablet Take 1,000 mg by mouth 2 (two) times daily with a meal.    [provider]  sitaGLIPtin (JANUVIA) 50 MG tablet Take 50 mg by mouth daily.     [provider]  tolterodine (DETROL LA) 4 MG 24 hr capsule  10/15/13   [provider]      Allergies    Patient has no known allergies.    Review of Systems   Review of Systems  HENT:  Positive for ear pain and hearing loss.   All other systems reviewed and are negative.   Physical Exam Updated Vital Signs BP (!) 192/97 (BP Location: Left Arm)   Pulse 70   Temp 97.8 F (36.6 C) (Oral)   Resp 18   Ht 5\' 8"  (1.727 m)   Wt 111.1 kg   SpO2 99%   BMI 37.25 kg/m  Physical Exam Vitals and nursing note reviewed.  Constitutional:      General: He is not in acute distress. HENT:     Head: Normocephalic and atraumatic.     Right Ear: Decreased hearing noted. Drainage and swelling present.     Left Ear: Tympanic membrane, ear canal and external ear normal.     Ears:     Comments: Unable to visualize TM, swelling of the external auditory canal present with discharge consistent with otitis externa Eyes:     Conjunctiva/sclera: Conjunctivae normal.     Pupils: Pupils are equal, round, and reactive to light.  Cardiovascular:     Rate and Rhythm: Normal rate and regular rhythm.  Pulmonary:     Effort: Pulmonary effort is normal. No respiratory  distress.  Abdominal:     General: There is no distension.     Tenderness: There is no guarding.  Musculoskeletal:        General: No deformity or signs of injury.     Cervical back: Neck supple.  Skin:    Findings: No lesion or rash.  Neurological:     General: No focal deficit present.     Mental Status: He is alert. Mental status is at baseline.     ED Results / Procedures / Treatments   Labs (all labs ordered are listed, but only abnormal results are displayed) Labs Reviewed - No data to display  EKG None  Radiology No results found.  Procedures Procedures    Medications Ordered in ED Medications - No data to display  ED Course/ Medical Decision Making/ A&P                             Medical  Decision Making Risk Prescription drug management.    67 year old male with medical history significant for diabetes mellitus, HTN presenting to the emergency department with ear pain and decreased hearing for the past couple of days.  He denies any fevers or chills.  He denies any significant pain or swelling behind the ear.  He states that he had a similar issue 1 year ago and symptoms cleared up with antibiotics.  On arrival, the patient was vitally stable.  Presenting with concern for decreased hearing and ear pain for the past few days.  Exam concerning for otitis externa with swelling of the external auditory canal and discharge present.  Will treat with Ciprodex drops and a course of amoxicillin, have the patient follow-up with his PCP and/or ENT as needed.   Final Clinical Impression(s) / ED Diagnoses Final diagnoses:  Acute otitis externa of right ear, unspecified type    Rx / DC Orders ED Discharge Orders          Ordered    ciprofloxacin-dexamethasone (CIPRODEX) OTIC suspension  2 times daily        09/19/22 0814    amoxicillin (AMOXIL) 875 MG tablet  2 times daily        09/19/22 0814    Ambulatory referral to ENT        09/19/22 0815              Ernie Avena, MD 09/19/22 [phone]

## 2022-09-19 NOTE — Discharge Instructions (Addendum)
You have evidence of an external ear infection.  You will be prescribed Ciprodex drops in addition to amoxicillin.  Please follow-up for reassessment with your PCP and/your ENT

## 2022-09-19 NOTE — ED Triage Notes (Signed)
Pt states right ear is clogged and has decreased hearing to that side x 2 days.  No known fever.

## 2022-10-19 DIAGNOSIS — Z0001 Encounter for general adult medical examination with abnormal findings: Secondary | ICD-10-CM | POA: Diagnosis not present

## 2022-10-19 DIAGNOSIS — E559 Vitamin D deficiency, unspecified: Secondary | ICD-10-CM | POA: Diagnosis not present

## 2022-10-19 DIAGNOSIS — M19041 Primary osteoarthritis, right hand: Secondary | ICD-10-CM | POA: Diagnosis not present

## 2022-10-19 DIAGNOSIS — I10 Essential (primary) hypertension: Secondary | ICD-10-CM | POA: Diagnosis not present

## 2022-10-19 DIAGNOSIS — E1129 Type 2 diabetes mellitus with other diabetic kidney complication: Secondary | ICD-10-CM | POA: Diagnosis not present

## 2022-10-19 DIAGNOSIS — R809 Proteinuria, unspecified: Secondary | ICD-10-CM | POA: Diagnosis not present

## 2022-10-19 DIAGNOSIS — E782 Mixed hyperlipidemia: Secondary | ICD-10-CM | POA: Diagnosis not present

## 2022-10-19 DIAGNOSIS — E1151 Type 2 diabetes mellitus with diabetic peripheral angiopathy without gangrene: Secondary | ICD-10-CM | POA: Diagnosis not present

## 2022-10-19 DIAGNOSIS — Z125 Encounter for screening for malignant neoplasm of prostate: Secondary | ICD-10-CM | POA: Diagnosis not present

## 2022-10-26 ENCOUNTER — Other Ambulatory Visit: Payer: Self-pay

## 2022-10-26 ENCOUNTER — Inpatient Hospital Stay (HOSPITAL_BASED_OUTPATIENT_CLINIC_OR_DEPARTMENT_OTHER)
Admission: EM | Admit: 2022-10-26 | Discharge: 2022-10-30 | DRG: 291 | Disposition: A | Discharge status: Home / Self Care | Payer: Medicare Other | Attending: Internal Medicine | Admitting: Internal Medicine

## 2022-10-26 ENCOUNTER — Emergency Department (HOSPITAL_BASED_OUTPATIENT_CLINIC_OR_DEPARTMENT_OTHER): Payer: Medicare Other

## 2022-10-26 DIAGNOSIS — E876 Hypokalemia: Secondary | ICD-10-CM | POA: Diagnosis present

## 2022-10-26 DIAGNOSIS — I130 Hypertensive heart and chronic kidney disease with heart failure and stage 1 through stage 4 chronic kidney disease, or unspecified chronic kidney disease: Secondary | ICD-10-CM | POA: Diagnosis not present

## 2022-10-26 DIAGNOSIS — N189 Chronic kidney disease, unspecified: Secondary | ICD-10-CM | POA: Diagnosis present

## 2022-10-26 DIAGNOSIS — I350 Nonrheumatic aortic (valve) stenosis: Secondary | ICD-10-CM | POA: Diagnosis present

## 2022-10-26 DIAGNOSIS — N400 Enlarged prostate without lower urinary tract symptoms: Secondary | ICD-10-CM | POA: Diagnosis present

## 2022-10-26 DIAGNOSIS — Z833 Family history of diabetes mellitus: Secondary | ICD-10-CM

## 2022-10-26 DIAGNOSIS — R0602 Shortness of breath: Secondary | ICD-10-CM

## 2022-10-26 DIAGNOSIS — I161 Hypertensive emergency: Secondary | ICD-10-CM | POA: Diagnosis present

## 2022-10-26 DIAGNOSIS — J90 Pleural effusion, not elsewhere classified: Secondary | ICD-10-CM | POA: Diagnosis not present

## 2022-10-26 DIAGNOSIS — N179 Acute kidney failure, unspecified: Secondary | ICD-10-CM | POA: Diagnosis not present

## 2022-10-26 DIAGNOSIS — J810 Acute pulmonary edema: Secondary | ICD-10-CM

## 2022-10-26 DIAGNOSIS — I509 Heart failure, unspecified: Secondary | ICD-10-CM

## 2022-10-26 DIAGNOSIS — R918 Other nonspecific abnormal finding of lung field: Secondary | ICD-10-CM | POA: Diagnosis not present

## 2022-10-26 DIAGNOSIS — Z79899 Other long term (current) drug therapy: Secondary | ICD-10-CM

## 2022-10-26 DIAGNOSIS — E785 Hyperlipidemia, unspecified: Secondary | ICD-10-CM | POA: Diagnosis present

## 2022-10-26 DIAGNOSIS — E669 Obesity, unspecified: Secondary | ICD-10-CM | POA: Diagnosis not present

## 2022-10-26 DIAGNOSIS — R0989 Other specified symptoms and signs involving the circulatory and respiratory systems: Secondary | ICD-10-CM | POA: Diagnosis not present

## 2022-10-26 DIAGNOSIS — R911 Solitary pulmonary nodule: Secondary | ICD-10-CM | POA: Diagnosis not present

## 2022-10-26 DIAGNOSIS — E1122 Type 2 diabetes mellitus with diabetic chronic kidney disease: Secondary | ICD-10-CM | POA: Diagnosis not present

## 2022-10-26 DIAGNOSIS — I5033 Acute on chronic diastolic (congestive) heart failure: Secondary | ICD-10-CM | POA: Diagnosis present

## 2022-10-26 DIAGNOSIS — N3281 Overactive bladder: Secondary | ICD-10-CM | POA: Diagnosis present

## 2022-10-26 DIAGNOSIS — N401 Enlarged prostate with lower urinary tract symptoms: Secondary | ICD-10-CM | POA: Diagnosis present

## 2022-10-26 DIAGNOSIS — Z823 Family history of stroke: Secondary | ICD-10-CM

## 2022-10-26 DIAGNOSIS — J811 Chronic pulmonary edema: Secondary | ICD-10-CM | POA: Diagnosis not present

## 2022-10-26 DIAGNOSIS — Z8249 Family history of ischemic heart disease and other diseases of the circulatory system: Secondary | ICD-10-CM

## 2022-10-26 DIAGNOSIS — N182 Chronic kidney disease, stage 2 (mild): Secondary | ICD-10-CM | POA: Diagnosis present

## 2022-10-26 DIAGNOSIS — Z791 Long term (current) use of non-steroidal anti-inflammatories (NSAID): Secondary | ICD-10-CM

## 2022-10-26 DIAGNOSIS — G4733 Obstructive sleep apnea (adult) (pediatric): Secondary | ICD-10-CM | POA: Diagnosis present

## 2022-10-26 DIAGNOSIS — E1165 Type 2 diabetes mellitus with hyperglycemia: Secondary | ICD-10-CM | POA: Diagnosis present

## 2022-10-26 DIAGNOSIS — Z6837 Body mass index (BMI) 37.0-37.9, adult: Secondary | ICD-10-CM

## 2022-10-26 DIAGNOSIS — I10 Essential (primary) hypertension: Secondary | ICD-10-CM | POA: Diagnosis present

## 2022-10-26 DIAGNOSIS — K5790 Diverticulosis of intestine, part unspecified, without perforation or abscess without bleeding: Secondary | ICD-10-CM | POA: Diagnosis not present

## 2022-10-26 DIAGNOSIS — K429 Umbilical hernia without obstruction or gangrene: Secondary | ICD-10-CM | POA: Diagnosis not present

## 2022-10-26 DIAGNOSIS — E119 Type 2 diabetes mellitus without complications: Principal | ICD-10-CM

## 2022-10-26 DIAGNOSIS — R590 Localized enlarged lymph nodes: Secondary | ICD-10-CM | POA: Diagnosis present

## 2022-10-26 DIAGNOSIS — Z7984 Long term (current) use of oral hypoglycemic drugs: Secondary | ICD-10-CM

## 2022-10-26 LAB — RAPID URINE DRUG SCREEN, HOSP PERFORMED
Amphetamines: NOT DETECTED
Barbiturates: NOT DETECTED
Benzodiazepines: NOT DETECTED
Cocaine: NOT DETECTED
Opiates: NOT DETECTED
Tetrahydrocannabinol: NOT DETECTED

## 2022-10-26 LAB — CBC
HCT: 33.5 % — ABNORMAL LOW (ref 39.0–52.0)
Hemoglobin: 11.3 g/dL — ABNORMAL LOW (ref 13.0–17.0)
MCH: 31.9 pg (ref 26.0–34.0)
MCHC: 33.7 g/dL (ref 30.0–36.0)
MCV: 94.6 fL (ref 80.0–100.0)
Platelets: 223 10*3/uL (ref 150–400)
RBC: 3.54 MIL/uL — ABNORMAL LOW (ref 4.22–5.81)
RDW: 14.1 % (ref 11.5–15.5)
WBC: 6.8 10*3/uL (ref 4.0–10.5)
nRBC: 0 % (ref 0.0–0.2)

## 2022-10-26 LAB — COMPREHENSIVE METABOLIC PANEL WITH GFR
ALT: 35 U/L (ref 0–44)
AST: 27 U/L (ref 15–41)
Albumin: 3.5 g/dL (ref 3.5–5.0)
Alkaline Phosphatase: 81 U/L (ref 38–126)
Anion gap: 10 (ref 5–15)
BUN: 18 mg/dL (ref 8–23)
CO2: 25 mmol/L (ref 22–32)
Calcium: 8.4 mg/dL — ABNORMAL LOW (ref 8.9–10.3)
Chloride: 103 mmol/L (ref 98–111)
Creatinine, Ser: 1.26 mg/dL — ABNORMAL HIGH (ref 0.61–1.24)
GFR, Estimated: >60 mL/min
Glucose, Bld: 165 mg/dL — ABNORMAL HIGH (ref 70–99)
Potassium: 3.1 mmol/L — ABNORMAL LOW (ref 3.5–5.1)
Sodium: 138 mmol/L (ref 135–145)
Total Bilirubin: 0.4 mg/dL (ref 0.3–1.2)
Total Protein: 6.6 g/dL (ref 6.5–8.1)

## 2022-10-26 LAB — MAGNESIUM: Magnesium: 2 mg/dL (ref 1.7–2.4)

## 2022-10-26 LAB — TROPONIN I (HIGH SENSITIVITY)
Troponin I (High Sensitivity): 21 ng/L — ABNORMAL HIGH (ref <18)
Troponin I (High Sensitivity): 21 ng/L — ABNORMAL HIGH

## 2022-10-26 LAB — BRAIN NATRIURETIC PEPTIDE: B Natriuretic Peptide: 436.4 pg/mL — ABNORMAL HIGH (ref 0.0–100.0)

## 2022-10-26 MED ORDER — LABETALOL HCL 5 MG/ML IV SOLN
20.0000 mg | Freq: Once | INTRAVENOUS | Status: AC
  Administered 2022-10-26: 20 mg via INTRAVENOUS
  Filled 2022-10-26: qty 4

## 2022-10-26 MED ORDER — NITROGLYCERIN 2 % TD OINT
1.0000 [in_us] | TOPICAL_OINTMENT | Freq: Once | TRANSDERMAL | Status: AC
  Administered 2022-10-26: 1 [in_us] via TOPICAL
  Filled 2022-10-26: qty 1

## 2022-10-26 MED ORDER — NITROGLYCERIN IN D5W 200-5 MCG/ML-% IV SOLN
0.0000 ug/min | INTRAVENOUS | Status: DC
  Administered 2022-10-26: 5 ug/min via INTRAVENOUS
  Administered 2022-10-27: 60 ug/min via INTRAVENOUS
  Administered 2022-10-28: 65 ug/min via INTRAVENOUS
  Administered 2022-10-28: 50 ug/min via INTRAVENOUS
  Filled 2022-10-26 (×4): qty 250

## 2022-10-26 MED ORDER — FUROSEMIDE 10 MG/ML IJ SOLN
40.0000 mg | Freq: Once | INTRAMUSCULAR | Status: AC
  Administered 2022-10-26: 40 mg via INTRAVENOUS
  Filled 2022-10-26: qty 4

## 2022-10-26 MED ORDER — POTASSIUM CHLORIDE 10 MEQ/100ML IV SOLN
10.0000 meq | INTRAVENOUS | Status: AC
  Administered 2022-10-26 (×3): 10 meq via INTRAVENOUS
  Filled 2022-10-26 (×3): qty 100

## 2022-10-26 MED ORDER — CARVEDILOL 6.25 MG PO TABS
6.2500 mg | ORAL_TABLET | Freq: Once | ORAL | Status: AC
  Administered 2022-10-26: 6.25 mg via ORAL
  Filled 2022-10-26: qty 1

## 2022-10-26 MED ORDER — LOSARTAN POTASSIUM 25 MG PO TABS
100.0000 mg | ORAL_TABLET | Freq: Once | ORAL | Status: AC
  Administered 2022-10-26: 100 mg via ORAL
  Filled 2022-10-26: qty 4

## 2022-10-26 MED ORDER — IOHEXOL 350 MG/ML SOLN
125.0000 mL | Freq: Once | INTRAVENOUS | Status: AC | PRN
  Administered 2022-10-26: 125 mL via INTRAVENOUS

## 2022-10-26 NOTE — ED Provider Notes (Signed)
.  Critical Care  Performed by: Zadie Rhine, MD Authorized by: Zadie Rhine, MD   Critical care provider statement:    Critical care time (minutes):  48   Critical care start time:  10/26/2022 6:00 PM   Critical care end time:  10/26/2022 6:48 PM   Critical care time was exclusive of:  Separately billable procedures and treating other patients   Critical care was necessary to treat or prevent imminent or life-threatening deterioration of the following conditions:  Cardiac failure, circulatory failure and respiratory failure   Critical care was time spent personally by me on the following activities:  Examination of patient, re-evaluation of patient's condition, pulse oximetry, ordering and review of radiographic studies, discussions with consultants and development of treatment plan with patient or surrogate   I assumed direction of critical care for this patient from another provider in my specialty: yes     Care discussed with: admitting provider    I assumed care of patient in signout to follow-up on imaging. Imaging reveals pulmonary edema and lung nodule, no signs of acute aortic syndrome  Patient still has significant hypertension.  He has been given Lasix as well as Nitropaste with minimal improvement.  He is already received labetalol.  Patient be admitted to Dr. Adela Glimpse with hospitalist service. He will be admitted to progressive care.  She request that I start nitroglycerin drip Nitropaste has been discontinued, will start nitro drip I have also ordered his home medications which include carvedilol and losartan  Resting comfort this time, no signs of acute respiratory failure. Patient noted to have a lung nodule that we will need follow-up later on  BP (!) 220/135   Pulse 73   Temp 98.6 F (37 C) (Oral)   Resp (!) 32   SpO2 98%     Zadie Rhine, MD 10/26/22 1850

## 2022-10-26 NOTE — ED Triage Notes (Signed)
Patient presents to ED via POV from home. Here with shortness of breath x 2 weeks. Possible dx of CHF, patient uncertain. Reports shortness of breath is worse on exertion.

## 2022-10-26 NOTE — ED Notes (Signed)
Lab to add Mag to existing samples

## 2022-10-26 NOTE — ED Provider Notes (Signed)
[place] [facility] Provider Note   CSN: [id number] Arrival date & time: 10/26/22  1309     History  Chief Complaint  Patient presents with   Shortness of Breath    Nehan Treshon Stannard is a 67 y.o. male.   Shortness of Breath 67 year old male history of type 2 diabetes, hypertension, hyperlipidemia, CKD presenting for shortness of breath.  Patient states for the last 2 weeks he has had exertional shortness of breath.  It has gradually worsened.  He called his doctor today who told him to go to the ED.  Is not acutely worse today.  Has bilateral lower extremity edema which is unchanged has been there for months.  No history of DVT or PE.  He has no chest pain or pleuritic pain.  No back pain.  No headache or neurologic changes.  No fevers or chills or cough.  No known history of heart failure.  No prior history of ACS or stroke.  He does endorse recently driving to [place] and back.  He has history of hypertension and took his medications this morning.  Blood pressure is usually much better than it is right now.     Home Medications Prior to Admission medications   Medication Sig Start Date End Date Taking? Authorizing Provider  amLODipine-benazepril (LOTREL) 5-40 MG capsule Take 1 capsule by mouth daily. 12/13/19 01/12/20  Maxwell Caul, PA-C  benzonatate (TESSALON) 100 MG capsule Take 1 capsule (100 mg total) by mouth every 8 (eight) hours. 09/01/21   Melene Plan, DO  carvedilol (COREG) 6.25 MG tablet Take 1 tablet (6.25 mg total) by mouth 2 (two) times daily. 12/13/19 01/12/20  Maxwell Caul, PA-C  glimepiride (AMARYL) 4 MG tablet  09/07/17   [provider]  hydrALAZINE (APRESOLINE) 100 MG tablet Take 1 tablet (100 mg total) by mouth 2 (two) times daily. 12/13/19 01/12/20  Maxwell Caul, PA-C  meloxicam (MOBIC) 7.5 MG tablet Take 1 tablet (7.5 mg total) by mouth daily. 07/01/21   Myra Rude, MD  metFORMIN (GLUCOPHAGE) 1000 MG  tablet Take 1,000 mg by mouth 2 (two) times daily with a meal.    [provider]  sitaGLIPtin (JANUVIA) 50 MG tablet Take 50 mg by mouth daily.    [provider]  tolterodine (DETROL LA) 4 MG 24 hr capsule  10/15/13   [provider]      Allergies    Patient has no known allergies.    Review of Systems   Review of Systems  Respiratory:  Positive for shortness of breath.   Review of systems completed and notable as per HPI.  ROS otherwise negative.   Physical Exam Updated Vital Signs BP (!) 199/114   Pulse 72   Temp 98.7 F (37.1 C) (Oral)   Resp 19   SpO2 99%  Physical Exam Vitals and nursing note reviewed.  Constitutional:      General: He is not in acute distress.    Appearance: He is well-developed.  HENT:     Head: Normocephalic and atraumatic.  Eyes:     Conjunctiva/sclera: Conjunctivae normal.  Cardiovascular:     Rate and Rhythm: Normal rate and regular rhythm.     Heart sounds: No murmur heard. Pulmonary:     Effort: Pulmonary effort is normal. No respiratory distress.     Breath sounds: Normal breath sounds.  Abdominal:     Palpations: Abdomen is soft.     Tenderness: There  is no abdominal tenderness.  Musculoskeletal:        General: No swelling.     Cervical back: Neck supple.     Right lower leg: Edema present.     Left lower leg: Edema present.  Skin:    General: Skin is warm and dry.     Capillary Refill: Capillary refill takes less than 2 seconds.  Neurological:     Mental Status: He is alert.  Psychiatric:        Mood and Affect: Mood normal.     ED Results / Procedures / Treatments   Labs (all labs ordered are listed, but only abnormal results are displayed) Labs Reviewed  CBC - Abnormal; Notable for the following components:      Result Value   RBC 3.54 (*)    Hemoglobin 11.3 (*)    HCT 33.5 (*)    All other components within normal limits  COMPREHENSIVE METABOLIC PANEL - Abnormal; Notable for the  following components:   Potassium 3.1 (*)    Glucose, Bld 165 (*)    Creatinine, Ser 1.26 (*)    Calcium 8.4 (*)    All other components within normal limits  TROPONIN I (HIGH SENSITIVITY) - Abnormal; Notable for the following components:   Troponin I (High Sensitivity) 21 (*)    All other components within normal limits  MAGNESIUM  BRAIN NATRIURETIC PEPTIDE  TROPONIN I (HIGH SENSITIVITY)    EKG EKG Interpretation Date/Time:  Monday October 26 2022 13:20:38 EDT Ventricular Rate:  71 PR Interval:  181 QRS Duration:  88 QT Interval:  417 QTC Calculation: 454 R Axis:   42  Text Interpretation: Sinus rhythm Atrial premature complex Probable left atrial enlargement Nonspecific T abnormalities, lateral leads Confirmed by Fulton Reek [phone]) on 10/26/2022 1:44:59 PM  Radiology DG Chest 2 View  Result Date: 10/26/2022 CLINICAL DATA:  SOB EXAM: CHEST - 2 VIEW COMPARISON:  None Available. FINDINGS: There is central vascular congestion along with diffuse ground-glass changes throughout bilateral lungs, concerning for congestive heart failure/pulmonary edema. There are additional opacities in the bilateral lower lobes, which may represent superimposed pneumonia versus atelectasis. Correlate clinically. Bilateral costophrenic angles are clear. Mildly enlarged cardio-mediastinal silhouette. No acute osseous abnormalities. The soft tissues are within normal limits. IMPRESSION: 1. Congestive heart failure/pulmonary edema. 2. Bilateral lower lobe opacities, which may represent superimposed pneumonia versus atelectasis. Electronically Signed   By: Jules Schick M.D.   On: 10/26/2022 14:43    Procedures Procedures    Medications Ordered in ED Medications  labetalol (NORMODYNE) injection 20 mg (has no administration in time range)  potassium chloride 10 mEq in 100 mL IVPB (has no administration in time range)  furosemide (LASIX) injection 40 mg (has no administration in time range)  labetalol  (NORMODYNE) injection 20 mg (20 mg Intravenous Given 10/26/22 1430)    ED Course/ Medical Decision Making/ A&P                             Medical Decision Making Amount and/or Complexity of Data Reviewed Labs: ordered. Radiology: ordered.  Risk Prescription drug management.   Medical Decision Making:   Cedrick Partain is a 67 y.o. male who presented to the ED today with 2 weeks of shortness of breath.  Vital signs reviewed notable for marked hypertension.  On exam he has slight increased work of breathing otherwise not tachypneic.  He has mild lower extremity edema and appears  volume up.  Differential including ACS, heart failure.  Dissection considered, however no pain at all and good pulses in all extremities, seems less consistent with this.  Also consider possible PE although lower extremity edema symmetric, no tachycardia, no hypoxia appears volume up more consistent with heart failure.  Will obtain thorough workup, also consider possible hypertensive emergency based on symptoms, will give IV labetalol given he is already taken his home medications.   Patient placed on continuous vitals and telemetry monitoring while in ED which was reviewed periodically.  Reviewed and confirmed nursing documentation for past medical history, family history, social history.  Initial Study Results:   Laboratory  All laboratory results reviewed.  Labs notable for mild troponin elevation, mild creatinine elevation, mild hypokalemia, mild anemia.  EKG EKG was reviewed independently. Rate, rhythm, axis, intervals all examined and without medically relevant abnormality. ST segments without concerns for elevations.    Radiology:  All images reviewed independently.  Chest x-ray with evidence of pulmonary edema and possible widened mediastinum.  Agree with radiology report at this time.    Reassessment and Plan:   On reassessment, minimal response to initial labetalol dose, repeat ordered.  X-ray is  concerning for acute heart failure, will order Lasix after potassium repletion.  I also am concerned for possible mediastinal widening on his chest x-ray and given marked hypertension will obtain CT chest for evaluation of possible dissection.  Handoff was given to Dr. Bebe Shaggy at 3:30 PM with plan to follow-up workup and admit for new onset heart failure.   Patient's presentation is most consistent with acute presentation with potential threat to life or bodily function.           Final Clinical Impression(s) / ED Diagnoses Final diagnoses:  SOB (shortness of breath)    Rx / DC Orders ED Discharge Orders     None         Laurence Spates, MD 10/26/22 [phone]

## 2022-10-27 ENCOUNTER — Encounter (HOSPITAL_COMMUNITY): Payer: Self-pay | Admitting: Internal Medicine

## 2022-10-27 DIAGNOSIS — N400 Enlarged prostate without lower urinary tract symptoms: Secondary | ICD-10-CM | POA: Diagnosis not present

## 2022-10-27 DIAGNOSIS — E876 Hypokalemia: Secondary | ICD-10-CM | POA: Diagnosis present

## 2022-10-27 DIAGNOSIS — G4733 Obstructive sleep apnea (adult) (pediatric): Secondary | ICD-10-CM | POA: Diagnosis present

## 2022-10-27 DIAGNOSIS — Q231 Congenital insufficiency of aortic valve: Secondary | ICD-10-CM | POA: Diagnosis not present

## 2022-10-27 DIAGNOSIS — N189 Chronic kidney disease, unspecified: Secondary | ICD-10-CM | POA: Diagnosis not present

## 2022-10-27 DIAGNOSIS — Z791 Long term (current) use of non-steroidal anti-inflammatories (NSAID): Secondary | ICD-10-CM | POA: Diagnosis not present

## 2022-10-27 DIAGNOSIS — E119 Type 2 diabetes mellitus without complications: Secondary | ICD-10-CM | POA: Diagnosis not present

## 2022-10-27 DIAGNOSIS — E1165 Type 2 diabetes mellitus with hyperglycemia: Secondary | ICD-10-CM | POA: Diagnosis present

## 2022-10-27 DIAGNOSIS — N1831 Chronic kidney disease, stage 3a: Secondary | ICD-10-CM | POA: Diagnosis not present

## 2022-10-27 DIAGNOSIS — I350 Nonrheumatic aortic (valve) stenosis: Secondary | ICD-10-CM | POA: Diagnosis present

## 2022-10-27 DIAGNOSIS — Z833 Family history of diabetes mellitus: Secondary | ICD-10-CM | POA: Diagnosis not present

## 2022-10-27 DIAGNOSIS — N179 Acute kidney failure, unspecified: Secondary | ICD-10-CM | POA: Diagnosis present

## 2022-10-27 DIAGNOSIS — E669 Obesity, unspecified: Secondary | ICD-10-CM | POA: Diagnosis present

## 2022-10-27 DIAGNOSIS — I509 Heart failure, unspecified: Secondary | ICD-10-CM

## 2022-10-27 DIAGNOSIS — Z79899 Other long term (current) drug therapy: Secondary | ICD-10-CM | POA: Diagnosis not present

## 2022-10-27 DIAGNOSIS — N3281 Overactive bladder: Secondary | ICD-10-CM | POA: Diagnosis present

## 2022-10-27 DIAGNOSIS — Z6837 Body mass index (BMI) 37.0-37.9, adult: Secondary | ICD-10-CM | POA: Diagnosis not present

## 2022-10-27 DIAGNOSIS — R911 Solitary pulmonary nodule: Secondary | ICD-10-CM | POA: Diagnosis present

## 2022-10-27 DIAGNOSIS — I161 Hypertensive emergency: Secondary | ICD-10-CM | POA: Diagnosis present

## 2022-10-27 DIAGNOSIS — I129 Hypertensive chronic kidney disease with stage 1 through stage 4 chronic kidney disease, or unspecified chronic kidney disease: Secondary | ICD-10-CM | POA: Diagnosis not present

## 2022-10-27 DIAGNOSIS — J810 Acute pulmonary edema: Secondary | ICD-10-CM | POA: Diagnosis not present

## 2022-10-27 DIAGNOSIS — E1122 Type 2 diabetes mellitus with diabetic chronic kidney disease: Secondary | ICD-10-CM | POA: Diagnosis present

## 2022-10-27 DIAGNOSIS — N183 Chronic kidney disease, stage 3 unspecified: Secondary | ICD-10-CM | POA: Diagnosis not present

## 2022-10-27 DIAGNOSIS — I10 Essential (primary) hypertension: Secondary | ICD-10-CM | POA: Diagnosis not present

## 2022-10-27 DIAGNOSIS — R590 Localized enlarged lymph nodes: Secondary | ICD-10-CM | POA: Diagnosis present

## 2022-10-27 DIAGNOSIS — I110 Hypertensive heart disease with heart failure: Secondary | ICD-10-CM | POA: Diagnosis not present

## 2022-10-27 DIAGNOSIS — I160 Hypertensive urgency: Secondary | ICD-10-CM | POA: Diagnosis not present

## 2022-10-27 DIAGNOSIS — Z7984 Long term (current) use of oral hypoglycemic drugs: Secondary | ICD-10-CM | POA: Diagnosis not present

## 2022-10-27 DIAGNOSIS — I130 Hypertensive heart and chronic kidney disease with heart failure and stage 1 through stage 4 chronic kidney disease, or unspecified chronic kidney disease: Secondary | ICD-10-CM | POA: Diagnosis present

## 2022-10-27 DIAGNOSIS — F329 Major depressive disorder, single episode, unspecified: Secondary | ICD-10-CM | POA: Insufficient documentation

## 2022-10-27 DIAGNOSIS — E785 Hyperlipidemia, unspecified: Secondary | ICD-10-CM | POA: Diagnosis present

## 2022-10-27 DIAGNOSIS — I5033 Acute on chronic diastolic (congestive) heart failure: Secondary | ICD-10-CM | POA: Diagnosis present

## 2022-10-27 DIAGNOSIS — I5030 Unspecified diastolic (congestive) heart failure: Secondary | ICD-10-CM | POA: Diagnosis not present

## 2022-10-27 DIAGNOSIS — R7989 Other specified abnormal findings of blood chemistry: Secondary | ICD-10-CM | POA: Diagnosis not present

## 2022-10-27 DIAGNOSIS — Z823 Family history of stroke: Secondary | ICD-10-CM | POA: Diagnosis not present

## 2022-10-27 DIAGNOSIS — I5031 Acute diastolic (congestive) heart failure: Secondary | ICD-10-CM | POA: Diagnosis not present

## 2022-10-27 DIAGNOSIS — Z8249 Family history of ischemic heart disease and other diseases of the circulatory system: Secondary | ICD-10-CM | POA: Diagnosis not present

## 2022-10-27 DIAGNOSIS — N401 Enlarged prostate with lower urinary tract symptoms: Secondary | ICD-10-CM | POA: Diagnosis present

## 2022-10-27 DIAGNOSIS — N182 Chronic kidney disease, stage 2 (mild): Secondary | ICD-10-CM | POA: Diagnosis present

## 2022-10-27 LAB — CBC WITH DIFFERENTIAL/PLATELET
Abs Immature Granulocytes: 0.03 10*3/uL (ref 0.00–0.07)
Basophils Absolute: 0.1 10*3/uL (ref 0.0–0.1)
Basophils Relative: 1 %
Eosinophils Absolute: 0.2 10*3/uL (ref 0.0–0.5)
Eosinophils Relative: 2 %
HCT: 33.3 % — ABNORMAL LOW (ref 39.0–52.0)
Hemoglobin: 11.2 g/dL — ABNORMAL LOW (ref 13.0–17.0)
Immature Granulocytes: 0 %
Lymphocytes Relative: 19 %
Lymphs Abs: 1.4 10*3/uL (ref 0.7–4.0)
MCH: 31.5 pg (ref 26.0–34.0)
MCHC: 33.6 g/dL (ref 30.0–36.0)
MCV: 93.8 fL (ref 80.0–100.0)
Monocytes Absolute: 0.8 10*3/uL (ref 0.1–1.0)
Monocytes Relative: 10 %
Neutro Abs: 5.3 10*3/uL (ref 1.7–7.7)
Neutrophils Relative %: 68 %
Platelets: 237 10*3/uL (ref 150–400)
RBC: 3.55 MIL/uL — ABNORMAL LOW (ref 4.22–5.81)
RDW: 14.2 % (ref 11.5–15.5)
WBC: 7.8 10*3/uL (ref 4.0–10.5)
nRBC: 0 % (ref 0.0–0.2)

## 2022-10-27 LAB — BASIC METABOLIC PANEL WITH GFR
Anion gap: 10 (ref 5–15)
BUN: 17 mg/dL (ref 8–23)
CO2: 27 mmol/L (ref 22–32)
Calcium: 8.5 mg/dL — ABNORMAL LOW (ref 8.9–10.3)
Chloride: 101 mmol/L (ref 98–111)
Creatinine, Ser: 1.4 mg/dL — ABNORMAL HIGH (ref 0.61–1.24)
GFR, Estimated: 55 mL/min — ABNORMAL LOW
Glucose, Bld: 230 mg/dL — ABNORMAL HIGH (ref 70–99)
Potassium: 3.3 mmol/L — ABNORMAL LOW (ref 3.5–5.1)
Sodium: 138 mmol/L (ref 135–145)

## 2022-10-27 LAB — GLUCOSE, CAPILLARY
Glucose-Capillary: 239 mg/dL — ABNORMAL HIGH (ref 70–99)
Glucose-Capillary: 264 mg/dL — ABNORMAL HIGH (ref 70–99)

## 2022-10-27 LAB — HIV ANTIBODY (ROUTINE TESTING W REFLEX): HIV Screen 4th Generation wRfx: NONREACTIVE

## 2022-10-27 LAB — HEMOGLOBIN A1C
Hgb A1c MFr Bld: 7.7 % — ABNORMAL HIGH (ref 4.8–5.6)
Mean Plasma Glucose: 174.29 mg/dL

## 2022-10-27 MED ORDER — POTASSIUM CHLORIDE CRYS ER 20 MEQ PO TBCR
40.0000 meq | EXTENDED_RELEASE_TABLET | Freq: Once | ORAL | Status: AC
  Administered 2022-10-27: 40 meq via ORAL
  Filled 2022-10-27: qty 2

## 2022-10-27 MED ORDER — TAMSULOSIN HCL 0.4 MG PO CAPS
0.4000 mg | ORAL_CAPSULE | Freq: Every day | ORAL | Status: DC
  Administered 2022-10-28 – 2022-10-30 (×3): 0.4 mg via ORAL
  Filled 2022-10-27 (×3): qty 1

## 2022-10-27 MED ORDER — FUROSEMIDE 10 MG/ML IJ SOLN
40.0000 mg | Freq: Once | INTRAMUSCULAR | Status: AC
  Administered 2022-10-27: 40 mg via INTRAVENOUS
  Filled 2022-10-27: qty 4

## 2022-10-27 MED ORDER — SODIUM CHLORIDE 0.9% FLUSH
3.0000 mL | Freq: Two times a day (BID) | INTRAVENOUS | Status: DC
  Administered 2022-10-27 – 2022-10-30 (×4): 3 mL via INTRAVENOUS

## 2022-10-27 MED ORDER — ATORVASTATIN CALCIUM 80 MG PO TABS
80.0000 mg | ORAL_TABLET | Freq: Every evening | ORAL | Status: DC
  Administered 2022-10-27 – 2022-10-29 (×3): 80 mg via ORAL
  Filled 2022-10-27 (×3): qty 1

## 2022-10-27 MED ORDER — GLIPIZIDE ER 10 MG PO TB24
10.0000 mg | ORAL_TABLET | Freq: Every day | ORAL | Status: DC
  Filled 2022-10-27: qty 1

## 2022-10-27 MED ORDER — ACETAMINOPHEN 650 MG RE SUPP: 650.0000 mg | Freq: Four times a day (QID) | RECTAL | Status: DC | PRN

## 2022-10-27 MED ORDER — METFORMIN HCL 500 MG PO TABS
500.0000 mg | ORAL_TABLET | Freq: Two times a day (BID) | ORAL | Status: DC
  Administered 2022-10-27 – 2022-10-28 (×3): 500 mg via ORAL
  Filled 2022-10-27 (×3): qty 1

## 2022-10-27 MED ORDER — INSULIN ASPART 100 UNIT/ML IJ SOLN: 0.0000 [IU] | Freq: Three times a day (TID) | INTRAMUSCULAR | Status: DC

## 2022-10-27 MED ORDER — CARVEDILOL 12.5 MG PO TABS
12.5000 mg | ORAL_TABLET | Freq: Two times a day (BID) | ORAL | Status: DC
  Administered 2022-10-27 – 2022-10-29 (×4): 12.5 mg via ORAL
  Filled 2022-10-27 (×4): qty 1

## 2022-10-27 MED ORDER — ACETAMINOPHEN 325 MG PO TABS
650.0000 mg | ORAL_TABLET | Freq: Once | ORAL | Status: AC
  Administered 2022-10-27: 650 mg via ORAL

## 2022-10-27 MED ORDER — ACETAMINOPHEN 325 MG PO TABS
650.0000 mg | ORAL_TABLET | Freq: Four times a day (QID) | ORAL | Status: DC | PRN
  Administered 2022-10-27 – 2022-10-29 (×6): 650 mg via ORAL
  Filled 2022-10-27 (×6): qty 2

## 2022-10-27 MED ORDER — INSULIN ASPART 100 UNIT/ML IJ SOLN
0.0000 [IU] | Freq: Three times a day (TID) | INTRAMUSCULAR | Status: DC
  Administered 2022-10-27: 3 [IU] via SUBCUTANEOUS
  Administered 2022-10-28: 2 [IU] via SUBCUTANEOUS
  Administered 2022-10-28 (×2): 3 [IU] via SUBCUTANEOUS
  Administered 2022-10-29: 2 [IU] via SUBCUTANEOUS
  Administered 2022-10-29: 3 [IU] via SUBCUTANEOUS
  Administered 2022-10-29 – 2022-10-30 (×2): 2 [IU] via SUBCUTANEOUS

## 2022-10-27 MED ORDER — HYDROCODONE-ACETAMINOPHEN 5-325 MG PO TABS
1.0000 | ORAL_TABLET | Freq: Once | ORAL | Status: AC
  Administered 2022-10-27: 1 via ORAL
  Filled 2022-10-27: qty 1

## 2022-10-27 MED ORDER — KETOROLAC TROMETHAMINE 15 MG/ML IJ SOLN
INTRAMUSCULAR | Status: AC
  Administered 2022-10-27: 15 mg via INTRAVENOUS
  Filled 2022-10-27: qty 1

## 2022-10-27 MED ORDER — KETOROLAC TROMETHAMINE 15 MG/ML IJ SOLN: 15.0000 mg | Freq: Once | INTRAMUSCULAR | Status: AC

## 2022-10-27 MED ORDER — POLYETHYLENE GLYCOL 3350 17 G PO PACK: 17.0000 g | PACK | Freq: Every day | ORAL | Status: DC | PRN

## 2022-10-27 MED ORDER — ACETAMINOPHEN 325 MG PO TABS
ORAL_TABLET | ORAL | Status: AC
  Filled 2022-10-27: qty 2

## 2022-10-27 MED ORDER — ENOXAPARIN SODIUM 40 MG/0.4ML IJ SOSY
40.0000 mg | PREFILLED_SYRINGE | INTRAMUSCULAR | Status: DC
  Administered 2022-10-27 – 2022-10-29 (×3): 40 mg via SUBCUTANEOUS
  Filled 2022-10-27 (×3): qty 0.4

## 2022-10-27 MED ORDER — CARVEDILOL 6.25 MG PO TABS
6.2500 mg | ORAL_TABLET | Freq: Two times a day (BID) | ORAL | Status: DC
  Administered 2022-10-27 (×2): 6.25 mg via ORAL
  Filled 2022-10-27 (×2): qty 1

## 2022-10-27 MED ORDER — HYDRALAZINE HCL 50 MG PO TABS
100.0000 mg | ORAL_TABLET | Freq: Three times a day (TID) | ORAL | Status: DC
  Administered 2022-10-27 – 2022-10-30 (×8): 100 mg via ORAL
  Filled 2022-10-27 (×8): qty 2

## 2022-10-27 MED ORDER — KETOROLAC TROMETHAMINE 30 MG/ML IJ SOLN: 15.0000 mg | Freq: Once | INTRAMUSCULAR | Status: DC

## 2022-10-27 MED ORDER — HYDRALAZINE HCL 50 MG PO TABS
100.0000 mg | ORAL_TABLET | Freq: Two times a day (BID) | ORAL | Status: DC
  Administered 2022-10-27: 100 mg via ORAL
  Filled 2022-10-27: qty 4

## 2022-10-27 MED ORDER — FESOTERODINE FUMARATE ER 4 MG PO TB24
4.0000 mg | ORAL_TABLET | Freq: Every day | ORAL | Status: DC
  Administered 2022-10-28 – 2022-10-30 (×3): 4 mg via ORAL
  Filled 2022-10-27 (×3): qty 1

## 2022-10-27 MED ORDER — LOSARTAN POTASSIUM 50 MG PO TABS
100.0000 mg | ORAL_TABLET | Freq: Every day | ORAL | Status: DC
  Administered 2022-10-27 – 2022-10-28 (×2): 100 mg via ORAL
  Filled 2022-10-27: qty 4
  Filled 2022-10-27: qty 2

## 2022-10-27 NOTE — ED Notes (Signed)
[facility] on the floor, report and paperwork given, pt belongings bagged/labeled and sent with pt

## 2022-10-27 NOTE — Plan of Care (Signed)

## 2022-10-27 NOTE — ED Notes (Signed)
Pt sitting on side of bed for meds and given wash cloth to wash face

## 2022-10-27 NOTE — ED Notes (Signed)
Introduced myself to pt and gave him coffee,  asked about his meds and gave them to dr Criss Alvine, pt [id number]

## 2022-10-27 NOTE — H&P (Signed)
History and Physical   Samuel Atkins ZOX:[id number] DOB: May 25, 1955 DOA: 10/26/2022  PCP: Jackie Plum, MD   Patient coming from: Home  Chief Complaint: Shortness of breath  HPI: Samuel Atkins is a 67 y.o. male with medical history significant of hypertension, diabetes, BPH, CKD, depression, OAB presenting with shortness of breath.  Patient reports 2 weeks of increasing shortness of breath especially on exertion.  Also reports some associated edema which has been going on for months.  Denies any known history of CHF.  Did take a recent trip to [place].  Called his PCP today who recommended patient get evaluated in the ED.  Report headache. Denies fevers, chills, chest pain, abdominal pain, constipation, diarrhea, nausea, vomiting.  ED Course: Vital signs in the ED notable for blood pressure in the 140s to 220s systolic, respiratory rate in the teens to 20s.  Lab workup included CMP with potassium 3.1, creatinine 1.26, glucose 163, calcium 8.4.  CBC with hemoglobin stable at 11.2.  Troponin flat at 21 x 2.  BNP elevated to 436.  Magnesium normal.  UDS negative.  Imaging included chest x-ray which showed evidence of CHF/pulmonary edema, bilateral lower lobe opacities possibly representing atelectasis versus pneumonia.  Bilateral lower lobe opacities possibly representing atelectasis versus pneumonia.  CTA of the chest abdomen and pelvis showed no acute aortic syndrome, no PE.  Changes found were consistent with pulmonary edema.  Small pleural effusions also noted.  Mild mediastinal lymphadenopathy noted by suspected to be reactive.  Solitary nodule of 4 mm noted with no recommendation recommended if patient is low risk.  Patient received Tylenol, Norco, Toradol, Coreg, Lasix, hydralazine, losartan, labetalol IV, was started on nitroglycerin drip, metformin, glipizide, and 30 mill equivalents IV potassium.  Patient was excepted for admission but has been holding in the ED.  Repeat labs  showed BMP with potassium proved to 3.3, creatinine now 1.4 from 1.26 today previous, glucose 230, calcium stable at 8.5.  Hemoglobin remained stable.  Patient received additional 40 mill equivalents of p.o. potassium and additional dose of Lasix.  Continues to have elevated blood pressures on nitroglycerin drip.  Review of Systems: As per HPI otherwise all other systems reviewed and are negative.  Past Medical History:  Diagnosis Date   Diabetes mellitus without complication (HCC)    Hypertension     No past surgical history on file.  Social History  reports that he has never smoked. He has never used smokeless tobacco. He reports current alcohol use. He reports that he does not currently use drugs.  No Known Allergies  Family History  Problem Relation Age of Onset   Stroke Mother    Heart failure Father    Hypertension Father    Diabetes Father    Diabetes Sister    Diabetes Sister    Diabetes Sister   Reviewed on admission  Prior to Admission medications   Medication Sig Start Date End Date Taking? Authorizing Provider  amLODipine-benazepril (LOTREL) 5-40 MG capsule Take 1 capsule by mouth daily. 12/13/19 01/12/20  Maxwell Caul, PA-C  benzonatate (TESSALON) 100 MG capsule Take 1 capsule (100 mg total) by mouth every 8 (eight) hours. 09/01/21   Melene Plan, DO  carvedilol (COREG) 6.25 MG tablet Take 1 tablet (6.25 mg total) by mouth 2 (two) times daily. 12/13/19 01/12/20  Maxwell Caul, PA-C  glimepiride (AMARYL) 4 MG tablet  09/07/17   [provider]  hydrALAZINE (APRESOLINE) 100 MG tablet Take 1 tablet (100 mg total) by  mouth 2 (two) times daily. 12/13/19 01/12/20  Maxwell Caul, PA-C  meloxicam (MOBIC) 7.5 MG tablet Take 1 tablet (7.5 mg total) by mouth daily. 07/01/21   Myra Rude, MD  metFORMIN (GLUCOPHAGE) 1000 MG tablet Take 1,000 mg by mouth 2 (two) times daily with a meal.    [provider]  sitaGLIPtin (JANUVIA) 50 MG tablet Take 50 mg by  mouth daily.    [provider]  tolterodine (DETROL LA) 4 MG 24 hr capsule  10/15/13   [provider]    Physical Exam: Vitals:   10/27/22 1230 10/27/22 1245 10/27/22 1300 10/27/22 1420  BP: (!) 198/140 (!) 180/99 (!) 178/105 (!) 180/107  Pulse: 76 77 82 75  Resp: 17 20 18 18   Temp:    98.9 F (37.2 C)  TempSrc:    Oral  SpO2: 99% 95% 96% 92%  Weight:    108.6 kg  Height:    5\' 7"  (1.702 m)    Physical Exam Constitutional:      General: He is not in acute distress.    Appearance: Normal appearance.  HENT:     Head: Normocephalic and atraumatic.     Mouth/Throat:     Mouth: Mucous membranes are moist.     Pharynx: Oropharynx is clear.  Eyes:     Extraocular Movements: Extraocular movements intact.     Pupils: Pupils are equal, round, and reactive to light.  Cardiovascular:     Rate and Rhythm: Normal rate and regular rhythm.     Pulses: Normal pulses.     Heart sounds: Normal heart sounds.  Pulmonary:     Effort: Pulmonary effort is normal. No respiratory distress.     Breath sounds: Rales (trace) present.  Abdominal:     General: Bowel sounds are normal. There is no distension.     Palpations: Abdomen is soft.     Tenderness: There is no abdominal tenderness.  Musculoskeletal:        General: No swelling or deformity.     Right lower leg: Edema present.     Left lower leg: Edema present.  Skin:    General: Skin is warm and dry.  Neurological:     General: No focal deficit present.     Mental Status: Mental status is at baseline.    Labs on Admission: I have personally reviewed following labs and imaging studies  CBC: Recent Labs  Lab 10/26/22 1346 10/27/22 0805  WBC 6.8 7.8  NEUTROABS  --  5.3  HGB 11.3* 11.2*  HCT 33.5* 33.3*  MCV 94.6 93.8  PLT 223 237    Basic Metabolic Panel: Recent Labs  Lab 10/26/22 1346 10/26/22 1422 10/27/22 0805  NA 138  --  138  K 3.1*  --  3.3*  CL 103  --  101  CO2 25  --  27  GLUCOSE 165*  --   230*  BUN 18  --  17  CREATININE 1.26*  --  1.40*  CALCIUM 8.4*  --  8.5*  MG  --  2.0  --     GFR: Estimated Creatinine Clearance: 60.2 mL/min (A) (by C-G formula based on SCr of 1.4 mg/dL (H)).  Liver Function Tests: Recent Labs  Lab 10/26/22 1346  AST 27  ALT 35  ALKPHOS 81  BILITOT 0.4  PROT 6.6  ALBUMIN 3.5    Urine analysis: No results found for: "COLORURINE", "APPEARANCEUR", "LABSPEC", "PHURINE", "GLUCOSEU", "HGBUR", "BILIRUBINUR", "KETONESUR", "PROTEINUR", "UROBILINOGEN", "NITRITE", "  LEUKOCYTESUR"  Radiological Exams on Admission: CT Angio Chest/Abd/Pel for Dissection W and/or Wo Contrast  Result Date: 10/26/2022 CLINICAL DATA:  Hypertension and shortness of breath EXAM: CT ANGIOGRAPHY CHEST, ABDOMEN AND PELVIS TECHNIQUE: Non-contrast CT of the chest was initially obtained. Multidetector CT imaging through the chest, abdomen and pelvis was performed using the standard protocol during bolus administration of intravenous contrast. Multiplanar reconstructed images and MIPs were obtained and reviewed to evaluate the vascular anatomy. RADIATION DOSE REDUCTION: This exam was performed according to the departmental dose-optimization program which includes automated exposure control, adjustment of the mA and/or kV according to patient size and/or use of iterative reconstruction technique. CONTRAST:  OMNIPAQUE IOHEXOL 350 MG/ML SOLN COMPARISON:  None Available. FINDINGS: CTA CHEST FINDINGS Cardiovascular: Normal heart size. Trace pericardial effusion. No visible coronary artery calcifications. No evidence of pulmonary embolus. Aortic valve calcifications. Mediastinum/Nodes: Esophagus and thyroid are unremarkable. Mildly enlarged mediastinal and hilar lymph nodes, likely reactive. Reference subcarinal lymph node measuring 12 mm in short axis on series 7, image 54. Reference right hilar lymph node measuring 13 mm in short axis on image 45. Lungs/Pleura: Central airways are patent.  Mild interlobular septal thickening. Mild diffuse ground-glass opacities with more focal mild right upper lobe ground-glass opacities. Solid pulmonary nodule of the right middle lobe measuring 4 mm on series 8, image 34. Small bilateral pleural effusions. Musculoskeletal: No chest wall abnormality. No acute or significant osseous findings. Review of the MIP images confirms the above findings. CTA ABDOMEN AND PELVIS FINDINGS VASCULAR Aorta: Normal caliber thoracic and abdominal aorta mild atherosclerotic disease. No evidence of acute aortic syndrome. Standard three-vessel aortic arch with no significant stenosis. Celiac: Patent.  No evidence of aneurysm, dissection or stenosis. SMA: Patent. No evidence of aneurysm, dissection or stenosis. Renals: Bilateral renal arteries are patent. No evidence of aneurysm, dissection or stenosis. IMA: Patent. No evidence of aneurysm, dissection or stenosis. Inflow: Patent. No evidence of aneurysm, dissection or stenosis. Veins: No obvious venous abnormality within the limitations of this arterial phase study. Review of the MIP images confirms the above findings. NON-VASCULAR Hepatobiliary: No focal liver abnormality is seen. No gallstones, gallbladder wall thickening, or biliary dilatation. Pancreas: Unremarkable. No pancreatic ductal dilatation or surrounding inflammatory changes. Spleen: Normal in size without focal abnormality. Adrenals/Urinary Tract: Bilateral adrenal glands are unremarkable. No hydronephrosis or nephrolithiasis. Bladder is unremarkable. Stomach/Bowel: Stomach is within normal limits. Appendix appears normal. Mild diverticulosis. No evidence of bowel wall thickening, distention, or inflammatory changes. Lymphatic: No enlarged lymph nodes seen in the abdomen or pelvis. Reproductive: Prostate is unremarkable. Other: Small fat containing periumbilical hernia. No abdominopelvic ascites. Musculoskeletal: No acute or significant osseous findings. Review of the MIP  images confirms the above findings. IMPRESSION: 1. No evidence of acute aortic syndrome. 2. No evidence of pulmonary embolus. 3. Mild interlobular septal thickening and diffuse ground-glass opacities with more focal right upper lobe ground-glass opacities, likely due to pulmonary edema. 4. Small bilateral pleural effusions. 5. Mildly enlarged mediastinal and hilar lymph nodes, likely reactive. 6. Solid pulmonary nodule of the right middle lobe measuring 4 mm. No follow-up needed if patient is low-risk.This recommendation follows the consensus statement: Guidelines for Management of Incidental Pulmonary Nodules Detected on CT Images: From the Fleischner Society 2017; Radiology 2017; 284:228-243. 7. Aortic valve calcifications. Findings can be seen in the setting of aortic sclerosis or stenosis. Correlate with echocardiography. 8. Mild aortic Atherosclerosis (ICD10-I70.0). Electronically Signed   By: Allegra Lai M.D.   On: 10/26/2022 16:37  DG Chest 2 View  Result Date: 10/26/2022 CLINICAL DATA:  SOB EXAM: CHEST - 2 VIEW COMPARISON:  None Available. FINDINGS: There is central vascular congestion along with diffuse ground-glass changes throughout bilateral lungs, concerning for congestive heart failure/pulmonary edema. There are additional opacities in the bilateral lower lobes, which may represent superimposed pneumonia versus atelectasis. Correlate clinically. Bilateral costophrenic angles are clear. Mildly enlarged cardio-mediastinal silhouette. No acute osseous abnormalities. The soft tissues are within normal limits. IMPRESSION: 1. Congestive heart failure/pulmonary edema. 2. Bilateral lower lobe opacities, which may represent superimposed pneumonia versus atelectasis. Electronically Signed   By: Jules Schick M.D.   On: 10/26/2022 14:43    EKG: Independently reviewed.  Sinus rhythm at 71 bpm.  PAC noted.  Nonspecific T wave flattening.  V2 V3 with likely repolarization abnormality/J-point  elevation.  Assessment/Plan Principal Problem:   CHF (congestive heart failure) (HCC) Active Problems:   Benign prostatic hyperplasia   Diabetes mellitus (HCC)   Hypertension   Chronic kidney disease   New onset CHF Hypertensive urgency ?Hypertensive emergency > Patient presenting with worsening shortness of breath and ongoing edema as per HPI. > Evidence of pulmonary edema on imaging.  BNP elevated to 436.  Troponin flat at 21 x 2.  Significantly elevated blood pressure in the 170s to 220s in the ED.  Started on multiple antihypertensive medications (including nitroglycerin drip) and received IV Lasix. > Likely represents new onset CHF with hypertensive urgency.  Less likely is hypertensive emergency. - Monitor on progressive unit - Continue with nitroglycerin drip, wean as tolerated  - Continue with IV Lasix - Strict I's and O's, daily weights - Echocardiogram - Trend renal function and electrolytes - Continue home losartan, carvedilol, amlodipine - Hold hydrochlorothiazide given addition of lasix  Hypokalemia CKD > Creatinine measurements thus far was 1.26, 1.4.  Most recent creatinine visible at [facility] records is 1.36 from 2022.  Appears to still be near baseline even is slightly increased from day prior. > Potassium on presentation was 3.1, received 30 mill equivalents of IV potassium and this improved to 3.3, has subsequently received additional 40 mill equivalents of p.o. potassium. - Will continue with IV Lasix as above - Trend renal function and electrolytes  Diabetes - SSI  BPH OAB - Continue home tamsulosin  DVT prophylaxis: Lovenox Code Status:   Full  Family Communication:  Updated at bedside  Disposition Plan:   Patient is from:  Home  Anticipated DC to:  Home  Anticipated DC date:  2 to 3 days  Anticipated DC barriers: None  Consults called:  None Admission status:  Inpatient, progressive  Severity of Illness: The appropriate patient status for this  patient is INPATIENT. Inpatient status is judged to be reasonable and necessary in order to provide the required intensity of service to ensure the patient's safety. The patient's presenting symptoms, physical exam findings, and initial radiographic and laboratory data in the context of their chronic comorbidities is felt to place them at high risk for further clinical deterioration. Furthermore, it is not anticipated that the patient will be medically stable for discharge from the hospital within 2 midnights of admission.   * I certify that at the point of admission it is my clinical judgment that the patient will require inpatient hospital care spanning beyond 2 midnights from the point of admission due to high intensity of service, high risk for further deterioration and high frequency of surveillance required.Synetta Fail MD [facility]  How to contact  the [facility] Attending or Consulting provider 7A - 7P or covering provider during after hours 7P -7A, for this patient?   Check the care team in [facility] and look for a) attending/consulting [facility] provider listed and b) the [facility] team listed Log into [email or web address] and use [place] universal password to access. If you do not have the password, please contact the hospital operator. Locate the [facility] provider you are looking for under [facility] and page to a number that you can be directly reached. If you still have difficulty reaching the provider, please page the [facility] (Director on Call) for the Hospitalists listed on amion for assistance.  10/27/2022, 2:43 PM

## 2022-10-27 NOTE — ED Notes (Signed)
Losartin was refilled per pharmacy in pyxis  Pt was updated on bed request and I told him hopefully beds available about 1200 or 1 pm

## 2022-10-27 NOTE — ED Notes (Signed)
Report called to [facility].

## 2022-10-27 NOTE — ED Notes (Signed)
Called [facility] for transport to [facility] @12 :17.  Spoke with Leotis Shames

## 2022-10-27 NOTE — Progress Notes (Signed)
Patient c/o 10/10 sharp chest pain with exertion. Pain went away shortly with rest. Patient is resting comfortably with family at bedside.

## 2022-10-27 NOTE — ED Notes (Signed)
Pt up to have birdbath at bedside, wife with pt , pt slightly sob upon returning to bed

## 2022-10-28 ENCOUNTER — Inpatient Hospital Stay (HOSPITAL_COMMUNITY): Payer: Medicare Other

## 2022-10-28 DIAGNOSIS — N400 Enlarged prostate without lower urinary tract symptoms: Secondary | ICD-10-CM | POA: Diagnosis not present

## 2022-10-28 DIAGNOSIS — I509 Heart failure, unspecified: Secondary | ICD-10-CM | POA: Diagnosis not present

## 2022-10-28 DIAGNOSIS — E119 Type 2 diabetes mellitus without complications: Secondary | ICD-10-CM | POA: Diagnosis not present

## 2022-10-28 DIAGNOSIS — N182 Chronic kidney disease, stage 2 (mild): Secondary | ICD-10-CM

## 2022-10-28 LAB — CBC
HCT: 31.2 % — ABNORMAL LOW (ref 39.0–52.0)
Hemoglobin: 10.4 g/dL — ABNORMAL LOW (ref 13.0–17.0)
MCH: 31 pg (ref 26.0–34.0)
MCHC: 33.3 g/dL (ref 30.0–36.0)
MCV: 93.1 fL (ref 80.0–100.0)
Platelets: 224 10*3/uL (ref 150–400)
RBC: 3.35 MIL/uL — ABNORMAL LOW (ref 4.22–5.81)
RDW: 14.2 % (ref 11.5–15.5)
WBC: 9.9 10*3/uL (ref 4.0–10.5)
nRBC: 0 % (ref 0.0–0.2)

## 2022-10-28 LAB — BASIC METABOLIC PANEL
Anion gap: 13 (ref 5–15)
BUN: 14 mg/dL (ref 8–23)
CO2: 25 mmol/L (ref 22–32)
Calcium: 8.5 mg/dL — ABNORMAL LOW (ref 8.9–10.3)
Chloride: 101 mmol/L (ref 98–111)
Creatinine, Ser: 1.62 mg/dL — ABNORMAL HIGH (ref 0.61–1.24)
GFR, Estimated: 46 mL/min — ABNORMAL LOW (ref >60)
Glucose, Bld: 183 mg/dL — ABNORMAL HIGH (ref 70–99)
Potassium: 3.2 mmol/L — ABNORMAL LOW (ref 3.5–5.1)
Sodium: 139 mmol/L (ref 135–145)

## 2022-10-28 LAB — ECHOCARDIOGRAM COMPLETE
AV Mean grad: 15.7 mmHg
AV Peak grad: 27.9 mmHg
Ao pk vel: 2.64 m/s
Area-P 1/2: 3.63 cm2
Height: 67 in
S' Lateral: 2.7 cm
Weight: 3801.6 oz

## 2022-10-28 LAB — GLUCOSE, CAPILLARY
Glucose-Capillary: 234 mg/dL — ABNORMAL HIGH (ref 70–99)
Glucose-Capillary: 241 mg/dL — ABNORMAL HIGH (ref 70–99)
Glucose-Capillary: 173 mg/dL — ABNORMAL HIGH (ref 70–99)
Glucose-Capillary: 217 mg/dL — ABNORMAL HIGH (ref 70–99)

## 2022-10-28 MED ORDER — OXYCODONE HCL 5 MG PO TABS
5.0000 mg | ORAL_TABLET | Freq: Once | ORAL | Status: AC
  Administered 2022-10-28: 5 mg via ORAL
  Filled 2022-10-28: qty 1

## 2022-10-28 MED ORDER — POTASSIUM CHLORIDE CRYS ER 20 MEQ PO TBCR
40.0000 meq | EXTENDED_RELEASE_TABLET | Freq: Once | ORAL | Status: AC
  Administered 2022-10-28: 40 meq via ORAL
  Filled 2022-10-28: qty 2

## 2022-10-28 MED ORDER — PERFLUTREN LIPID MICROSPHERE
1.0000 mL | INTRAVENOUS | Status: AC | PRN
  Administered 2022-10-28: 3 mL via INTRAVENOUS

## 2022-10-28 MED ORDER — AMLODIPINE BESYLATE 5 MG PO TABS
5.0000 mg | ORAL_TABLET | Freq: Every day | ORAL | Status: DC
  Administered 2022-10-28 – 2022-10-29 (×2): 5 mg via ORAL
  Filled 2022-10-28 (×2): qty 1

## 2022-10-28 NOTE — Inpatient Diabetes Management (Signed)
Inpatient Diabetes Program Recommendations  AACE/ADA: New Consensus Statement on Inpatient Glycemic Control (2015)  Target Ranges:  Prepandial:   less than 140 mg/dL      Peak postprandial:   less than 180 mg/dL (1-2 hours)      Critically ill patients:  140 - 180 mg/dL   Lab Results  Component Value Date   GLUCAP 234 (H) 10/28/2022   HGBA1C 7.7 (H) 10/27/2022    Review of Glycemic Control  Latest Reference Range & Units 10/27/22 14:52 10/27/22 21:30 10/28/22 08:01  Glucose-Capillary 70 - 99 mg/dL 191 (H) 478 (H) 295 (H)  (H): Data is abnormally high  Diabetes history: DM2 Outpatient Diabetes medications: Glipizide 10 mg QAM, Metformin 500 mg BID, Januvia 50 mg QD Current orders for Inpatient glycemic control: Novolog 0-9 units TID and Metformin 500 mg BID  Inpatient Diabetes Program Recommendations:    Semglee 12 units every day  Will continue to follow while inpatient.  Thank you, Dulce Sellar, MSN, CDCES Diabetes Coordinator Inpatient Diabetes Program [phone] (team pager from 8a-5p)

## 2022-10-28 NOTE — Progress Notes (Signed)
[name] is 3.2. Samuel Burn MD notified. No new orders

## 2022-10-28 NOTE — Consult Note (Signed)
CARDIOLOGY CONSULT NOTE  Patient ID: Samuel Atkins MRN: [id number] DOB/AGE: [id number] 67 y.o.  Admit date: 10/26/2022 Referring Physician: [facility] Reason for Consultation:  CHF  HPI:   67 y.o. African American male with uncontrolled hypertension, type 2 diabetes mellitus, with suspected heart failure.  Patient was seen by me in 2022 in outpatient setting for evaluation of syncope.  This most likely was thought to be related to dehydration and orthostasis.  However, patient did not undergo echocardiogram, cardiac telemetry ordered at that time.  He is now admitted to medicine service since 10/27/2022 with complaints of shortness of breath.  He reports 2 weeks of worsening progressive exertional dyspnea and leg edema.  He is also a chest pain with episodes of worsening breathing, no recent worsening of chest pain.  Troponin has been mildly elevated and flat, BNP has been elevated.  He has been hypertensive during this hospitalization, with blood pressure as high as 220s/140s.  He is noted to be mildly hypokalemic with potassium at 3.1-3.3.  CTA chest showed no PE or acute aortic syndrome, did show pulmonary edema and small pleural effusions.  He is currently on nitroglycerin drip for hypertensive urgency, carvedilol 12.5 mg twice daily (increased from 6.25 mg twice daily at home), hydralazine 100 mg 3 times daily, losartan 100 mg daily (increased from 50 mg daily at home), but remains hypertensive.  Past Medical History:  Diagnosis Date   Diabetes mellitus without complication (HCC)    Hypertension      History reviewed. No pertinent surgical history.    Family History  Problem Relation Age of Onset   Stroke Mother    Heart failure Father    Hypertension Father    Diabetes Father    Diabetes Sister    Diabetes Sister    Diabetes Sister      Social History: Social History   Socioeconomic History   Marital status: Married    Spouse name: Not on file   Number of  children: Not on file   Years of education: Not on file   Highest education level: Not on file  Occupational History   Not on file  Tobacco Use   Smoking status: Never   Smokeless tobacco: Never  Vaping Use   Vaping status: Never Used  Substance and Sexual Activity   Alcohol use: Yes    Comment: socially   Drug use: Not Currently   Sexual activity: Not on file  Other Topics Concern   Not on file  Social History Narrative   Not on file   Social Determinants of Health   Financial Resource Strain: Not on file  Food Insecurity: No Food Insecurity (10/27/2022)   Hunger Vital Sign    Worried About Running Out of Food in the Last Year: Never true    Ran Out of Food in the Last Year: Never true  Transportation Needs: No Transportation Needs (10/27/2022)   PRAPARE - Administrator, Civil Service (Medical): No    Lack of Transportation (Non-Medical): No  Physical Activity: Not on file  Stress: Not on file  Social Connections: Not on file  Intimate Partner Violence: Not At Risk (10/27/2022)   Humiliation, Afraid, Rape, and Kick questionnaire    Fear of Current or Ex-Partner: No    Emotionally Abused: No    Physically Abused: No    Sexually Abused: No     Medications Prior to Admission  Medication Sig Dispense Refill Last Dose   Alogliptin Benzoate 25  MG TABS Take 25 mg by mouth daily.   10/26/2022   atorvastatin (LIPITOR) 40 MG tablet Take 40 mg by mouth every evening.   10/25/2022   benzonatate (TESSALON) 100 MG capsule Take 1 capsule (100 mg total) by mouth every 8 (eight) hours. (Patient taking differently: Take 100 mg by mouth 3 (three) times daily as needed for cough.) 21 capsule 0 10/23/2022   carvedilol (COREG) 12.5 MG tablet Take 12.5 mg by mouth 2 (two) times daily with a meal.   10/26/2022   diclofenac Sodium (VOLTAREN) 1 % GEL Apply 2 g topically daily as needed (pain).   10/25/2022   glipiZIDE (GLUCOTROL) 10 MG tablet Take 10 mg by mouth daily before breakfast.    10/26/2022   hydrALAZINE (APRESOLINE) 100 MG tablet Take 1 tablet (100 mg total) by mouth 2 (two) times daily. (Patient taking differently: Take 100 mg by mouth 3 (three) times daily.) 60 tablet 0 10/26/2022   losartan (COZAAR) 50 MG tablet Take 50 mg by mouth daily.   10/26/2022   meloxicam (MOBIC) 15 MG tablet Take 15 mg by mouth daily as needed for pain.   10/26/2022   metFORMIN (GLUCOPHAGE) 1000 MG tablet Take 500 mg by mouth 2 (two) times daily with a meal.   10/26/2022   sildenafil (REVATIO) 20 MG tablet Take 20 mg by mouth daily as needed (erectile dysfunction).   Past Week   sitaGLIPtin (JANUVIA) 50 MG tablet Take 50 mg by mouth daily as needed (Blood sugar over 200).   10/13/2022   tamsulosin (FLOMAX) 0.4 MG CAPS capsule Take 0.4 mg by mouth daily.   10/26/2022   amLODipine-benazepril (LOTREL) 5-40 MG capsule Take 1 capsule by mouth daily. (Patient not taking: Reported on 10/27/2022) 30 capsule 0 Not Taking   carvedilol (COREG) 6.25 MG tablet Take 1 tablet (6.25 mg total) by mouth 2 (two) times daily. (Patient not taking: Reported on 10/27/2022) 60 tablet 0 Not Taking   meloxicam (MOBIC) 7.5 MG tablet Take 1 tablet (7.5 mg total) by mouth daily. (Patient not taking: Reported on 10/27/2022) 45 tablet 1 Not Taking    Review of Systems  Cardiovascular:  Positive for chest pain, dyspnea on exertion and leg swelling. Negative for palpitations and syncope.     Vitals:   10/28/22 0716 10/28/22 1023  BP: (!) 158/102 (!) 148/96  Pulse:    Resp: 18   Temp: 98.3 F (36.8 C)   SpO2:      Physical Exam: Physical Exam Vitals and nursing note reviewed.  Constitutional:      General: He is not in acute distress. Neck:     Vascular: No JVD.  Cardiovascular:     Rate and Rhythm: Normal rate and regular rhythm.     Heart sounds: Normal heart sounds. No murmur heard. Pulmonary:     Effort: Pulmonary effort is normal.     Breath sounds: Normal breath sounds. No wheezing or rales.  Musculoskeletal:      Right lower leg: Edema (1+) present.     Left lower leg: Edema (1+) present.        Imaging/tests reviewed and independently interpreted: Lab Results: CBC, BMP, trop HS, BNP  Cardiac Studies:  Telemetry 7/24/204: Sinus rhythm, no significant arrhythmia  EKG 10/26/2022: Sinus rhythm Atrial premature complex Probable left atrial enlargement Nonspecific T abnormalities, lateral leads  Echocardiogram: Pending  CTA chest 10/26/2022: 1. No evidence of acute aortic syndrome. 2. No evidence of pulmonary embolus. 3. Mild interlobular septal thickening and  diffuse ground-glass opacities with more focal right upper lobe ground-glass opacities, likely due to pulmonary edema. 4. Small bilateral pleural effusions. 5. Mildly enlarged mediastinal and hilar lymph nodes, likely reactive. 6. Solid pulmonary nodule of the right middle lobe measuring 4 mm. No follow-up needed if patient is low-risk.This recommendation follows the consensus statement: Guidelines for Management of Incidental Pulmonary Nodules Detected on CT Images: From the Fleischner Society 2017; Radiology 2017; 284:228-243. 7. Aortic valve calcifications. Findings can be seen in the setting of aortic sclerosis or stenosis. Correlate with echocardiography. 8. Mild aortic Atherosclerosis (ICD10-I70.0).     Assessment & Recommendations:  67 y.o. African American male with uncontrolled hypertension, type 2 diabetes mellitus, with suspected heart failure.  Heart failure: Echocardiogram pending.  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 It appears that he is only received 1 dose of IV Lasix 40 mg, but has diuresed very well, net -3.5 L for hospital stay. Echocardiogram pending.  Check renal artery duplex, renin/aldosterone Currently on nitroglycerin drip, losartan 100 mg daily, carvedilol 12.5 mg twice daily, hydralazine 100 mg 3 times daily. He is still hypertensive around 160s/100s.   Will add isordil 40 mg tid.  After echocardiogram, will consider switching losartan ro Entresto and adding SGLT2i and MRA. Continue I/O monitoring, daily weights.  Troponin elevation: Secondary to hypertensive urgency and heart failure.  AKI/CKD: Secondary to hypertensive urgency, heart failure. Monitor for now. Avoid NSAIDS.  Discussed interpretation of tests and management recommendations with the primary team     Elder Negus, MD Pager: [phone] Office: [phone]

## 2022-10-28 NOTE — Progress Notes (Addendum)
PROGRESS NOTE    Samuel Atkins  NWG:[id number] DOB: 1955-12-21 DOA: 10/26/2022 PCP: Jackie Plum, MD    Brief Narrative:   Samuel Atkins is a 67 y.o. male with medical history significant of hypertension, diabetes, BPH, CKD, depression, overactive bladder presented to the hospital with  2 weeks of increasing shortness of breath especially on exertion with lower extremity edema, dyspnea.  No previous history of congestive heart failure. Called his PCP who recommended patient get evaluated in the ED. In the ED, vitals were notable for blood pressure in the 140s to 220s systolic, respiratory rate in the teens to 20s.  Lab workup included CMP with potassium 3.1, creatinine 1.26, glucose 163, calcium 8.4.  CBC with hemoglobin stable at 11.2.  Troponin flat at 21 x 2.  BNP elevated to 436.  Magnesium normal.  UDS negative.  Imaging included chest x-ray which showed evidence of CHF/pulmonary edema, bilateral lower lobe opacities possibly representing atelectasis versus pneumonia.  CTA of the chest abdomen and pelvis showed no acute aortic syndrome, no PE but consistent with pulmonary edema.  Small pleural effusions also noted.  Mild mediastinal lymphadenopathy noted by suspected to be reactive.  Solitary nodule of 4 mm noted with no recommendation recommended if patient is low risk.  Patient was initiated on nitroglycerin drip due to continued  elevated blood pressure and was admitted hospital for further evaluation.  Assessment and Plan:  New onset CHF/Hypertensive emergency Had pulmonary edema on imaging.  BNP elevated to 436.  Troponin flat at 21 x 2.  Significantly elevated blood pressure in the 170s to 220s in the ED.  Started on multiple antihypertensive medications (including nitroglycerin drip) and received IV Lasix.  On nitroglycerin drip.  Will continue to wean as able.  Noted rising creatinine.  Hold off with Lasix.  Consult cardiology.  Strict intake and output charting, Daily  weights.  Check 2D echocardiogram.  Creatinine elevated today compared to yesterday so we will continue to hold hydrochlorothiazide and losartan.  Continue Coreg and amlodipine.  Will consult cardiology due to new onset heart failure.   Hypokalemia Potassium still at 3.2.  Will continue to replace.  Check BMP in AM.  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  Most recent creatinine visible at [facility] records is 1.36 from 2022.  Patient presented with creatinine of 1.2 likely baseline level.  Creatinine today at 1.6 after diuresis.  Will need to continue to monitor.  Cardiology has been consulted.  Diabetes mellitus type II with hyperglycemia. Continue sliding scale insulin, Accu-Cheks, diabetic diet.  Latest POC glucose of 241.  Patient is on glipizide and metformin at home.  Hold oral hypoglycemics.  Hemoglobin A1c of 7.7.   BPH Continue  tamsulosin  Obesity.  Body mass index is 37.21 kg/m.  Patient would benefit from continued weight loss as outpatient.  Obstructive sleep apnea.  Patient uses CPAP at home.  Did use yesterday.  On supplemental oxygen at this time.  Encouraged him to use his CPAP at home.   DVT prophylaxis: enoxaparin (LOVENOX) injection 40 mg Start: 10/27/22 1530   Code Status:     Code Status: Full Code  Disposition: Home likely in 1 to 2 days, follow cardiology  recommendation.  Status is: Inpatient  Remains inpatient appropriate because: Decompensated heart failure, cardiology evaluation, diuretics, accelerated hypertension, nitro drip   Family Communication: I spoke with the patient's spouse on the phone and updated her about the clinical condition of the patient.  Consultants:  Cardiology  Procedures:  None  Antimicrobials:  None  Anti-infectives (From admission, onward)    None      Subjective: Today, patient was seen and examined at bedside. Complains of shortness of breath but little better today.  Has been having dyspnea on exertion at home.  He had gained 6  pounds of weight and lower extremity edema at home.  Objective: Vitals:   10/28/22 0534 10/28/22 0601 10/28/22 0716 10/28/22 1023  BP: (!) 184/109 (!) 192/104 (!) 158/102 (!) 148/96  Pulse: 94     Resp: 18  18   Temp: 98.7 F (37.1 C)  98.3 F (36.8 C)   TempSrc: Oral  Oral   SpO2: 91%     Weight: 107.8 kg     Height:        Intake/Output Summary (Last 24 hours) at 10/28/2022 1301 Last data filed at 10/28/2022 0912 Gross per 24 hour  Intake 677.13 ml  Output 375 ml  Net 302.13 ml   Filed Weights   10/27/22 1420 10/28/22 0534  Weight: 108.6 kg 107.8 kg    Physical Examination: Body mass index is 37.21 kg/m.   General: Obese built, not in obvious distress. HENT:   No scleral pallor or icterus noted. Oral mucosa is moist.  Chest:   Diminished breath sounds bilaterally.  Crackles noted mostly on the left side.. CVS: S1 &S2 heard. No murmur.  Regular rate and rhythm. Abdomen: Soft, nontender, nondistended.  Bowel sounds are heard.   Extremities: No cyanosis, clubbing with bilateral lower 45 extremity edema.  Peripheral pulses are palpable. Psych: Alert, awake and oriented, normal mood CNS:  No cranial nerve deficits.  Power equal in all extremities.   Skin: Warm and dry.  No rashes noted.  Data Reviewed:   CBC: Recent Labs  Lab 10/26/22 1346 10/27/22 0805 10/28/22 0229  WBC 6.8 7.8 9.9  NEUTROABS  --  5.3  --   HGB 11.3* 11.2* 10.4*  HCT 33.5* 33.3* 31.2*  MCV 94.6 93.8 93.1  PLT 223 237 224    Basic Metabolic Panel: Recent Labs  Lab 10/26/22 1346 10/26/22 1422 10/27/22 0805 10/28/22 0229  NA 138  --  138 139  K 3.1*  --  3.3* 3.2*  CL 103  --  101 101  CO2 25  --  27 25  GLUCOSE 165*  --  230* 183*  BUN 18  --  17 14  CREATININE 1.26*  --  1.40* 1.62*  CALCIUM 8.4*  --  8.5* 8.5*  MG  --  2.0  --   --     Liver Function Tests: Recent Labs  Lab 10/26/22 1346  AST 27  ALT 35  ALKPHOS 81  BILITOT 0.4  PROT 6.6  ALBUMIN 3.5     Radiology  Studies: CT Angio Chest/Abd/Pel for Dissection W and/or Wo Contrast  Result Date: 10/26/2022 CLINICAL DATA:  Hypertension and shortness of breath EXAM: CT ANGIOGRAPHY CHEST, ABDOMEN AND PELVIS TECHNIQUE: Non-contrast CT of the chest was initially obtained. Multidetector CT imaging through the chest, abdomen and pelvis was performed using the standard protocol during bolus administration of intravenous contrast. Multiplanar reconstructed images and MIPs were obtained and reviewed to evaluate the vascular anatomy. RADIATION DOSE REDUCTION: This exam was performed according to the departmental dose-optimization program which includes automated exposure control, adjustment of the mA and/or kV according to patient size and/or use of iterative reconstruction technique. CONTRAST:  OMNIPAQUE IOHEXOL 350 MG/ML SOLN COMPARISON:  None Available. FINDINGS: CTA  CHEST FINDINGS Cardiovascular: Normal heart size. Trace pericardial effusion. No visible coronary artery calcifications. No evidence of pulmonary embolus. Aortic valve calcifications. Mediastinum/Nodes: Esophagus and thyroid are unremarkable. Mildly enlarged mediastinal and hilar lymph nodes, likely reactive. Reference subcarinal lymph node measuring 12 mm in short axis on series 7, image 54. Reference right hilar lymph node measuring 13 mm in short axis on image 45. Lungs/Pleura: Central airways are patent. Mild interlobular septal thickening. Mild diffuse ground-glass opacities with more focal mild right upper lobe ground-glass opacities. Solid pulmonary nodule of the right middle lobe measuring 4 mm on series 8, image 34. Small bilateral pleural effusions. Musculoskeletal: No chest wall abnormality. No acute or significant osseous findings. Review of the MIP images confirms the above findings. CTA ABDOMEN AND PELVIS FINDINGS VASCULAR Aorta: Normal caliber thoracic and abdominal aorta mild atherosclerotic disease. No evidence of acute aortic syndrome. Standard  three-vessel aortic arch with no significant stenosis. Celiac: Patent.  No evidence of aneurysm, dissection or stenosis. SMA: Patent. No evidence of aneurysm, dissection or stenosis. Renals: Bilateral renal arteries are patent. No evidence of aneurysm, dissection or stenosis. IMA: Patent. No evidence of aneurysm, dissection or stenosis. Inflow: Patent. No evidence of aneurysm, dissection or stenosis. Veins: No obvious venous abnormality within the limitations of this arterial phase study. Review of the MIP images confirms the above findings. NON-VASCULAR Hepatobiliary: No focal liver abnormality is seen. No gallstones, gallbladder wall thickening, or biliary dilatation. Pancreas: Unremarkable. No pancreatic ductal dilatation or surrounding inflammatory changes. Spleen: Normal in size without focal abnormality. Adrenals/Urinary Tract: Bilateral adrenal glands are unremarkable. No hydronephrosis or nephrolithiasis. Bladder is unremarkable. Stomach/Bowel: Stomach is within normal limits. Appendix appears normal. Mild diverticulosis. No evidence of bowel wall thickening, distention, or inflammatory changes. Lymphatic: No enlarged lymph nodes seen in the abdomen or pelvis. Reproductive: Prostate is unremarkable. Other: Small fat containing periumbilical hernia. No abdominopelvic ascites. Musculoskeletal: No acute or significant osseous findings. Review of the MIP images confirms the above findings. IMPRESSION: 1. No evidence of acute aortic syndrome. 2. No evidence of pulmonary embolus. 3. Mild interlobular septal thickening and diffuse ground-glass opacities with more focal right upper lobe ground-glass opacities, likely due to pulmonary edema. 4. Small bilateral pleural effusions. 5. Mildly enlarged mediastinal and hilar lymph nodes, likely reactive. 6. Solid pulmonary nodule of the right middle lobe measuring 4 mm. No follow-up needed if patient is low-risk.This recommendation follows the consensus statement:  Guidelines for Management of Incidental Pulmonary Nodules Detected on CT Images: From the Fleischner Society 2017; Radiology 2017; 284:228-243. 7. Aortic valve calcifications. Findings can be seen in the setting of aortic sclerosis or stenosis. Correlate with echocardiography. 8. Mild aortic Atherosclerosis (ICD10-I70.0). Electronically Signed   By: Allegra Lai M.D.   On: 10/26/2022 16:37   DG Chest 2 View  Result Date: 10/26/2022 CLINICAL DATA:  SOB EXAM: CHEST - 2 VIEW COMPARISON:  None Available. FINDINGS: There is central vascular congestion along with diffuse ground-glass changes throughout bilateral lungs, concerning for congestive heart failure/pulmonary edema. There are additional opacities in the bilateral lower lobes, which may represent superimposed pneumonia versus atelectasis. Correlate clinically. Bilateral costophrenic angles are clear. Mildly enlarged cardio-mediastinal silhouette. No acute osseous abnormalities. The soft tissues are within normal limits. IMPRESSION: 1. Congestive heart failure/pulmonary edema. 2. Bilateral lower lobe opacities, which may represent superimposed pneumonia versus atelectasis. Electronically Signed   By: Jules Schick M.D.   On: 10/26/2022 14:43      LOS: 1 day     Joycelyn Das, MD  [facility] Available via Epic secure chat 7am-7pm After these hours, please refer to coverage provider listed on [email or web address] 10/28/2022, 1:01 PM

## 2022-10-29 DIAGNOSIS — E119 Type 2 diabetes mellitus without complications: Secondary | ICD-10-CM | POA: Diagnosis not present

## 2022-10-29 DIAGNOSIS — N182 Chronic kidney disease, stage 2 (mild): Secondary | ICD-10-CM | POA: Diagnosis not present

## 2022-10-29 DIAGNOSIS — I509 Heart failure, unspecified: Secondary | ICD-10-CM | POA: Diagnosis not present

## 2022-10-29 DIAGNOSIS — N400 Enlarged prostate without lower urinary tract symptoms: Secondary | ICD-10-CM | POA: Diagnosis not present

## 2022-10-29 LAB — GLUCOSE, CAPILLARY
Glucose-Capillary: 167 mg/dL — ABNORMAL HIGH (ref 70–99)
Glucose-Capillary: 193 mg/dL — ABNORMAL HIGH (ref 70–99)
Glucose-Capillary: 233 mg/dL — ABNORMAL HIGH (ref 70–99)

## 2022-10-29 LAB — BASIC METABOLIC PANEL
Anion gap: 10 (ref 5–15)
CO2: 24 mmol/L (ref 22–32)
Chloride: 100 mmol/L (ref 98–111)
Creatinine, Ser: 1.61 mg/dL — ABNORMAL HIGH (ref 0.61–1.24)
GFR, Estimated: 47 mL/min — ABNORMAL LOW (ref >60)
Glucose, Bld: 184 mg/dL — ABNORMAL HIGH (ref 70–99)
Potassium: 3.6 mmol/L (ref 3.5–5.1)
Sodium: 134 mmol/L — ABNORMAL LOW (ref 135–145)

## 2022-10-29 LAB — CBC
HCT: 31.9 % — ABNORMAL LOW (ref 39.0–52.0)
MCH: 32.2 pg (ref 26.0–34.0)
MCV: 96.1 fL (ref 80.0–100.0)
Platelets: 227 10*3/uL (ref 150–400)
RBC: 3.32 MIL/uL — ABNORMAL LOW (ref 4.22–5.81)
RDW: 14.3 % (ref 11.5–15.5)
WBC: 8.7 10*3/uL (ref 4.0–10.5)

## 2022-10-29 LAB — MAGNESIUM: Magnesium: 1.9 mg/dL (ref 1.7–2.4)

## 2022-10-29 MED ORDER — CARVEDILOL 12.5 MG PO TABS
12.5000 mg | ORAL_TABLET | Freq: Once | ORAL | Status: AC
  Administered 2022-10-29: 12.5 mg via ORAL
  Filled 2022-10-29: qty 1

## 2022-10-29 MED ORDER — AMLODIPINE BESYLATE 5 MG PO TABS
5.0000 mg | ORAL_TABLET | Freq: Once | ORAL | Status: AC
  Administered 2022-10-29: 5 mg via ORAL
  Filled 2022-10-29: qty 1

## 2022-10-29 MED ORDER — NITROGLYCERIN IN D5W 200-5 MCG/ML-% IV SOLN
INTRAVENOUS | Status: AC
  Administered 2022-10-29: 10 ug/min via INTRAVENOUS
  Filled 2022-10-29: qty 250

## 2022-10-29 MED ORDER — AMLODIPINE BESYLATE 10 MG PO TABS
10.0000 mg | ORAL_TABLET | Freq: Every day | ORAL | Status: DC
  Administered 2022-10-30: 10 mg via ORAL
  Filled 2022-10-29: qty 1

## 2022-10-29 MED ORDER — CARVEDILOL 25 MG PO TABS
25.0000 mg | ORAL_TABLET | Freq: Two times a day (BID) | ORAL | Status: DC
  Administered 2022-10-29 – 2022-10-30 (×2): 25 mg via ORAL
  Filled 2022-10-29 (×2): qty 1

## 2022-10-29 MED ORDER — LOSARTAN POTASSIUM 50 MG PO TABS
50.0000 mg | ORAL_TABLET | Freq: Every day | ORAL | Status: DC
  Administered 2022-10-29 – 2022-10-30 (×2): 50 mg via ORAL
  Filled 2022-10-29 (×2): qty 1

## 2022-10-29 MED ORDER — ISOSORBIDE DINITRATE 20 MG PO TABS
20.0000 mg | ORAL_TABLET | Freq: Three times a day (TID) | ORAL | Status: DC
  Administered 2022-10-29 – 2022-10-30 (×3): 20 mg via ORAL
  Filled 2022-10-29 (×3): qty 1

## 2022-10-29 NOTE — Progress Notes (Signed)
PROGRESS NOTE    Samuel Atkins  UEA:[id number] DOB: Jan 08, 1956 DOA: 10/26/2022 PCP: Jackie Plum, MD    Brief Narrative:   Samuel Atkins is a 67 y.o. male with medical history significant of hypertension, diabetes, BPH, CKD, depression, overactive bladder presented to the hospital with  2 weeks of increasing shortness of breath especially on exertion with lower extremity edema, dyspnea.  No previous history of congestive heart failure. Called his PCP who recommended patient get evaluated in the ED. In the ED, vitals were notable for blood pressure in the 140s to 220s systolic, respiratory rate in the teens to 20s.  Lab workup included CMP with potassium 3.1, creatinine 1.26, glucose 163, calcium 8.4.  CBC with hemoglobin stable at 11.2.  Troponin flat at 21 x 2.  BNP elevated to 436.  Magnesium normal.  UDS negative.  Imaging included chest x-ray which showed evidence of CHF/pulmonary edema, bilateral lower lobe opacities possibly representing atelectasis versus pneumonia.  CTA of the chest abdomen and pelvis showed no acute aortic syndrome, no PE but consistent with pulmonary edema.  Small pleural effusions also noted.  Mild mediastinal lymphadenopathy noted by suspected to be reactive.  Solitary nodule of 4 mm noted with no recommendation recommended if patient is low risk.  Patient was initiated on nitroglycerin drip due to continued  elevated blood pressure and was admitted hospital for further evaluation.  Assessment and Plan:  New onset CHF/Hypertensive emergency Had pulmonary edema on imaging.  BNP elevated to 436.  Troponin flat at 21 x 2.  Significantly elevated blood pressure in the 170s to 220s in the ED. Cardiology on board.  Plan for checking renal artery duplex, renin/aldosterone.  Currently on amlodipine, Coreg, hydralazine and nitro drip.  Plan is to wean off nitro drip today.  Losartan has been resumed.  Will increase his Coreg to 25 twice daily and amlodipine 10 mg  daily.  Plan for adding Isordil by tomorrow.    Strict intake and output charting, Daily weights.   2D echocardiogram with LV ejection fraction of 60 to 65% with grade 2 diastolic dysfunction..  Continue to hold hydrochlorothiazide.   Hypokalemia Potassium improved at 3.6 today.  Check levels in AM.  Continue potassium supplementation.  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  Most recent creatinine visible at [facility] records is 1.36 from 2022.  Patient presented with creatinine of 1.2,  Creatinine today at 1.6 after diuresis.  Will need to continue to monitor.  Cardiology on board.  Losartan has been initiated.  Diabetes mellitus type II with hyperglycemia. Continue sliding scale insulin, Accu-Cheks, diabetic diet.  Latest POC glucose of 167.Marland Kitchen  Patient is on glipizide and metformin at home.  Hold oral hypoglycemics.  Hemoglobin A1c of 7.7.   BPH Continue  tamsulosin  Obesity.  Body mass index is 37.62 kg/m.  Patient would benefit from continued weight loss as outpatient.  Obstructive sleep apnea.  Patient uses CPAP at home.  On supplemental oxygen at this time.  Encouraged him to use his CPAP at home.   DVT prophylaxis: enoxaparin (LOVENOX) injection 40 mg Start: 10/27/22 1530   Code Status:     Code Status: Full Code  Disposition: Home likely in 1 to 2 days, follow cardiology recommendations. Status is: Inpatient  Remains inpatient appropriate because: Decompensated heart failure,  accelerated hypertension, nitro drip   Family Communication:  I spoke with the patient's spouse  at bedside.  Consultants:  Cardiology  Procedures:  None  Antimicrobials:  None  Anti-infectives (From admission, onward)    None      Subjective: Today, patient was seen and examined at bedside.  Denies overt shortness of breath or dyspnea but has some mild shortness of breath on exertion.  Still feels little congested.  No chest pain.  Complains of headache.  Objective: Vitals:   10/29/22 0436 10/29/22  0532 10/29/22 0638 10/29/22 0746  BP: (!) 172/106 (!) 159/101 (!) 150/102 (!) 151/102  Pulse:  76  82  Resp: 20 16  17   Temp: 98.7 F (37.1 C) 98.4 F (36.9 C)  98.1 F (36.7 C)  TempSrc: Oral Oral  Oral  SpO2: 95%     Weight:  109 kg    Height:        Intake/Output Summary (Last 24 hours) at 10/29/2022 1119 Last data filed at 10/29/2022 0440 Gross per 24 hour  Intake 695.81 ml  Output 950 ml  Net -254.19 ml   Filed Weights   10/27/22 1420 10/28/22 0534 10/29/22 0532  Weight: 108.6 kg 107.8 kg 109 kg    Physical Examination: Body mass index is 37.62 kg/m.   General: Obese built, not in obvious distress. HENT:   No scleral pallor or icterus noted. Oral mucosa is moist.  Chest:   Diminished breath sounds bilaterally.   CVS: S1 &S2 heard. No murmur.  Regular rate and rhythm. Abdomen: Soft, nontender, nondistended.  Bowel sounds are heard.   Extremities: No cyanosis, clubbing with bilateral lower extremity edema.   Psych: Alert, awake and oriented, normal mood CNS:  No cranial nerve deficits.  Power equal in all extremities.   Skin: Warm and dry.  No rashes noted.  Data Reviewed:   CBC: Recent Labs  Lab 10/26/22 1346 10/27/22 0805 10/28/22 0229 10/29/22 0011  WBC 6.8 7.8 9.9 8.7  NEUTROABS  --  5.3  --   --   HGB 11.3* 11.2* 10.4* 10.7*  HCT 33.5* 33.3* 31.2* 31.9*  MCV 94.6 93.8 93.1 96.1  PLT 223 237 224 227    Basic Metabolic Panel: Recent Labs  Lab 10/26/22 1346 10/26/22 1422 10/27/22 0805 10/28/22 0229 10/29/22 0011  NA 138  --  138 139 134*  K 3.1*  --  3.3* 3.2* 3.6  CL 103  --  101 101 100  CO2 25  --  27 25 24   GLUCOSE 165*  --  230* 183* 184*  BUN 18  --  17 14 18   CREATININE 1.26*  --  1.40* 1.62* 1.61*  CALCIUM 8.4*  --  8.5* 8.5* 8.6*  MG  --  2.0  --   --  1.9    Liver Function Tests: Recent Labs  Lab 10/26/22 1346  AST 27  ALT 35  ALKPHOS 81  BILITOT 0.4  PROT 6.6  ALBUMIN 3.5     Radiology Studies: ECHOCARDIOGRAM  COMPLETE  Result Date: 10/28/2022    ECHOCARDIOGRAM REPORT   Patient Name:   Samuel Atkins Date of Exam: 10/28/2022 Medical Rec #:  [id number]          Height:       67.0 in Accession #:    [id number]         Weight:       237.6 lb Date of Birth:  01-Aug-1955          BSA:          2.176 m Patient Age:    22 years  BP:           158/102 mmHg Patient Gender: M                  HR:           80 bpm. Exam Location:  Inpatient Procedure: 2D Echo, Intracardiac Opacification Agent, Color Doppler and Cardiac            Doppler Indications:    Elevated BNP  History:        Patient has no prior history of Echocardiogram examinations.                 CHF, CKD; Risk Factors:Diabetes, Hypertension and Sleep Apnea.  Sonographer:    Milbert Coulter Referring Phys: Beola Cord, B  Sonographer Comments: Suboptimal apical window, no subcostal window and patient is obese. Image acquisition challenging due to patient body habitus. IMPRESSIONS  1. Left ventricular ejection fraction, by estimation, is 60 to 65%. The left ventricle has normal function. The left ventricle has no regional wall motion abnormalities. There is moderate left ventricular hypertrophy. Left ventricular diastolic parameters are consistent with Grade II diastolic dysfunction (pseudonormalization).  2. Right ventricular systolic function is normal. The right ventricular size is normal. There is normal pulmonary artery systolic pressure.  3. Left atrial size was mild to moderately dilated.  4. The mitral valve is grossly normal. Trivial mitral valve regurgitation. No evidence of mitral stenosis.  5. Highly suspicious for bicuspid aortic valve, but cannot be completely visualized. The aortic valve has an indeterminant number of cusps. There is moderate calcification of the aortic valve. There is moderate thickening of the aortic valve. Aortic valve regurgitation is not visualized. Mild aortic valve stenosis.  6. Aortic dilatation noted. There is mild  dilatation of the ascending aorta, measuring 40 mm.  7. The inferior vena cava is normal in size with greater than 50% respiratory variability, suggesting right atrial pressure of 3 mmHg. Comparison(s): No prior Echocardiogram. Conclusion(s)/Recommendation(s): High suspicion for bicuspid aortic valve, with mild aortic stenosis and mild ascending aorta dilation. FINDINGS  Left Ventricle: Left ventricular ejection fraction, by estimation, is 60 to 65%. The left ventricle has normal function. The left ventricle has no regional wall motion abnormalities. Definity contrast agent was given IV to delineate the left ventricular  endocardial borders. The left ventricular internal cavity size was normal in size. There is moderate left ventricular hypertrophy. Left ventricular diastolic parameters are consistent with Grade II diastolic dysfunction (pseudonormalization). Right Ventricle: The right ventricular size is normal. Right vetricular wall thickness was not well visualized. Right ventricular systolic function is normal. There is normal pulmonary artery systolic pressure. The tricuspid regurgitant velocity is 2.41 m/s, and with an assumed right atrial pressure of 3 mmHg, the estimated right ventricular systolic pressure is 26.2 mmHg. Left Atrium: Left atrial size was mild to moderately dilated. Right Atrium: Right atrial size was not well visualized. Pericardium: There is no evidence of pericardial effusion. Mitral Valve: The mitral valve is grossly normal. Trivial mitral valve regurgitation. No evidence of mitral valve stenosis. Tricuspid Valve: The tricuspid valve is grossly normal. Tricuspid valve regurgitation is trivial. No evidence of tricuspid stenosis. Aortic Valve: Highly suspicious for bicuspid aortic valve, but cannot be completely visualized. The aortic valve has an indeterminant number of cusps. There is moderate calcification of the aortic valve. There is moderate thickening of the aortic valve. Aortic valve  regurgitation is not visualized. Mild aortic stenosis is present. Aortic valve mean gradient measures 15.7 mmHg.  Aortic valve peak gradient measures 27.9 mmHg. Pulmonic Valve: The pulmonic valve was not well visualized. Pulmonic valve regurgitation is not visualized. No evidence of pulmonic stenosis. Aorta: Aortic dilatation noted. There is mild dilatation of the ascending aorta, measuring 40 mm. Venous: The inferior vena cava is normal in size with greater than 50% respiratory variability, suggesting right atrial pressure of 3 mmHg. IAS/Shunts: The interatrial septum was not well visualized.  LEFT VENTRICLE PLAX 2D LVIDd:         4.20 cm Diastology LVIDs:         2.70 cm LV e' medial:    4.57 cm/s LV PW:         1.30 cm LV E/e' medial:  23.9 LV IVS:        1.70 cm LV e' lateral:   6.31 cm/s                        LV E/e' lateral: 17.3  RIGHT VENTRICLE RV S prime:     14.80 cm/s TAPSE (M-mode): 2.1 cm LEFT ATRIUM             Index LA diam:        3.90 cm 1.79 cm/m LA Vol (A2C):   81.7 ml 37.55 ml/m LA Vol (A4C):   56.2 ml 25.83 ml/m LA Biplane Vol: 72.1 ml 33.14 ml/m  AORTIC VALVE AV Vmax:           264.33 cm/s AV Vmean:          181.000 cm/s AV VTI:            0.469 m AV Peak Grad:      27.9 mmHg AV Mean Grad:      15.7 mmHg LVOT Vmax:         110.00 cm/s LVOT Vmean:        73.300 cm/s LVOT VTI:          0.216 m LVOT/AV VTI ratio: 0.46  AORTA Ao Root diam: 3.30 cm Ao Asc diam:  4.00 cm MITRAL VALVE                TRICUSPID VALVE MV Area (PHT): 3.63 cm     TR Peak grad:   23.2 mmHg MV Decel Time: 209 msec     TR Vmax:        241.00 cm/s MV E velocity: 109.00 cm/s MV A velocity: 84.40 cm/s   SHUNTS MV E/A ratio:  1.29         Systemic VTI: 0.22 m Jodelle Red MD Electronically signed by Jodelle Red MD Signature Date/Time: 10/28/2022/9:02:18 PM    Final       LOS: 2 days     Joycelyn Das, MD [facility] Available via Epic secure chat 7am-7pm After these hours, please refer to  coverage provider listed on [email or web address] 10/29/2022, 11:19 AM

## 2022-10-29 NOTE — Progress Notes (Signed)
Attempted to wean nitroglycerin gtt, however patient's BP remained elevated. Paged Cardiology and medications adjusted. Per Rosemary Holms, MD ok to leave gtt at 20 mcg unless SBP> 180

## 2022-10-29 NOTE — Progress Notes (Signed)
Subjective:  Feels better   Current Facility-Administered Medications:    acetaminophen (TYLENOL) tablet 650 mg, 650 mg, Oral, Q6H PRN, 650 mg at 10/29/22 0438 **OR** acetaminophen (TYLENOL) suppository 650 mg, 650 mg, Rectal, Q6H PRN, Synetta Fail, MD   amLODipine (NORVASC) tablet 5 mg, 5 mg, Oral, Daily, Pokhrel, Laxman, MD, 5 mg at 10/29/22 0815   atorvastatin (LIPITOR) tablet 80 mg, 80 mg, Oral, QPM, Synetta Fail, MD, 80 mg at 10/28/22 1732   carvedilol (COREG) tablet 12.5 mg, 12.5 mg, Oral, BID WC, Synetta Fail, MD, 12.5 mg at 10/29/22 0815   enoxaparin (LOVENOX) injection 40 mg, 40 mg, Subcutaneous, Q24H, Synetta Fail, MD, 40 mg at 10/28/22 1521   fesoterodine (TOVIAZ) tablet 4 mg, 4 mg, Oral, Daily, Synetta Fail, MD, 4 mg at 10/28/22 1610   hydrALAZINE (APRESOLINE) tablet 100 mg, 100 mg, Oral, Q8H, Synetta Fail, MD, 100 mg at 10/29/22 0438   insulin aspart (novoLOG) injection 0-9 Units, 0-9 Units, Subcutaneous, TID WC, Synetta Fail, MD, 2 Units at 10/29/22 0816   polyethylene glycol (MIRALAX / GLYCOLAX) packet 17 g, 17 g, Oral, Daily PRN, Synetta Fail, MD   sodium chloride flush (NS) 0.9 % injection 3 mL, 3 mL, Intravenous, Q12H, Synetta Fail, MD, 3 mL at 10/29/22 0040   tamsulosin (FLOMAX) capsule 0.4 mg, 0.4 mg, Oral, Daily, Synetta Fail, MD, 0.4 mg at 10/29/22 0815   Objective:  Vital Signs in the last 24 hours: Temp:  [97.6 F (36.4 C)-98.8 F (37.1 C)] 98.1 F (36.7 C) (07/25 0746) Pulse Rate:  [70-82] 82 (07/25 0746) Resp:  [16-20] 17 (07/25 0746) BP: (137-179)/(86-106) 151/102 (07/25 0746) SpO2:  [94 %-95 %] 95 % (07/25 0436) Weight:  [960 kg] 109 kg (07/25 0532)  Intake/Output from previous day: 07/24 0701 - 07/25 0700 In: 875.8 [P.O.:500; I.V.:375.8] Out: 950 [Urine:950]  Physical Exam Vitals and nursing note reviewed.  Constitutional:      General: He is not in acute distress. Neck:      Vascular: No JVD.  Cardiovascular:     Rate and Rhythm: Normal rate and regular rhythm.     Heart sounds: Normal heart sounds. No murmur heard. Pulmonary:     Effort: Pulmonary effort is normal.     Breath sounds: Normal breath sounds. No wheezing or rales.  Musculoskeletal:     Right lower leg: Edema (Trace) present.     Left lower leg: Edema (Trace) present.      Imaging/tests reviewed and independently interpreted: CTA chest 10/26/2022: 1. No evidence of acute aortic syndrome. 2. No evidence of pulmonary embolus. 3. Mild interlobular septal thickening and diffuse ground-glass opacities with more focal right upper lobe ground-glass opacities, likely due to pulmonary edema. 4. Small bilateral pleural effusions. 5. Mildly enlarged mediastinal and hilar lymph nodes, likely reactive. 6. Solid pulmonary nodule of the right middle lobe measuring 4 mm. No follow-up needed if patient is low-risk.This recommendation follows the consensus statement: Guidelines for Management of Incidental Pulmonary Nodules Detected on CT Images: From the Fleischner Society 2017; Radiology 2017; 284:228-243. 7. Aortic valve calcifications. Findings can be seen in the setting of aortic sclerosis or stenosis. Correlate with echocardiography. 8. Mild aortic Atherosclerosis (ICD10-I70.0).  Cardiac Studies:  Telemetry 10/29/2022: Sinus rhythm, no significant arrhythmia  EKG 10/26/2022: Sinus rhythm Atrial premature complex Probable left atrial enlargement Nonspecific T abnormalities, lateral leads    Echocardiogram 10/28/2022:  1. Left ventricular ejection fraction, by estimation, is 60  to 65%. The  left ventricle has normal function. The left ventricle has no regional  wall motion abnormalities. There is moderate left ventricular hypertrophy.  Left ventricular diastolic parameters are consistent with Grade II diastolic dysfunction  (pseudonormalization).   2. Right ventricular systolic function is  normal. The right ventricular  size is normal. There is normal pulmonary artery systolic pressure.   3. Left atrial size was mild to moderately dilated.   4. The mitral valve is grossly normal. Trivial mitral valve  regurgitation. No evidence of mitral stenosis.   5. Highly suspicious for bicuspid aortic valve, but cannot be completely  visualized. The aortic valve has an indeterminant number of cusps. There  is moderate calcification of the aortic valve. There is moderate  thickening of the aortic valve. Aortic  valve regurgitation is not visualized. Mild aortic valve stenosis.   6. Aortic dilatation noted. There is mild dilatation of the ascending  aorta, measuring 40 mm.   7. The inferior vena cava is normal in size with greater than 50%  respiratory variability, suggesting right atrial pressure of 3 mmHg.   Comparison(s): No prior Echocardiogram.   Conclusion(s)/Recommendation(s): High suspicion for bicuspid aortic valve,  with mild aortic stenosis and mild ascending aorta dilation.     Assessment & Recommendations:  67 y.o. African American male with uncontrolled hypertension, type 2 diabetes mellitus, HFpEF  HFpEF: Normal EF, grade II diastolic dysfunction. Presentation more of that of hypertensive urgency than true heart failure decompensation. Hypertension management as below.   Hypertension:  Check renal artery duplex, renin/aldosterone Currently on amlodipine 5 mg daily, carvedilol 12.5 mg bid, hydralazine mg tid, nitroglycerin drip at 30 mic Hope to wean off nitroglycerin drip today. Cr is stable, okay to resume losartan probably at 50 mg daily.  Increase Coreg to 25 mg bid, amlodipine to 10 mg daily. I don't think Isordil got added. Will add tomorrow if BP remains uncontrolled.  Continue I/O monitoring, daily weights.  Bicuspid aortic valve: Mild stenosis with mild TAA. Continue beta blocker, ARB. Repeat echocardiogram in 1 year, outpatient.   Troponin  elevation: Secondary to hypertensive urgency and heart failure.   AKI/CKD: Secondary to hypertensive urgency, heart failure. Stable. Avoid NSAIDS.   Discussed interpretation of tests and management recommendations with the primary team     Elder Negus, MD Pager: [phone] Office: [phone]

## 2022-10-29 NOTE — Progress Notes (Signed)
Heart Failure Navigator Progress Note  Assessed for Heart & Vascular TOC clinic readiness.  Patient does not meet criteria due to [facility] .   Navigator will sign off at this time.    Dawn Fields, BSN, RN Heart Failure Nurse Navigator Secure Chat Only   

## 2022-10-29 NOTE — Inpatient Diabetes Management (Signed)
Inpatient Diabetes Program Recommendations  AACE/ADA: New Consensus Statement on Inpatient Glycemic Control   Target Ranges:  Prepandial:   less than 140 mg/dL      Peak postprandial:   less than 180 mg/dL (1-2 hours)      Critically ill patients:  140 - 180 mg/dL    Latest Reference Range & Units 10/28/22 08:01 10/28/22 11:49 10/28/22 15:55 10/28/22 20:43 10/29/22 07:45  Glucose-Capillary 70 - 99 mg/dL 161 (H) 096 (H) 045 (H) 217 (H) 167 (H)   Review of Glycemic Control  Diabetes history: DM2 Outpatient Diabetes medications: Glipizide 10 mg QAM, Metformin 500 mg BID, Januvia 50 mg daily Current orders for Inpatient glycemic control: Novolog 0-9 units TID with meals  Inpatient Diabetes Program Recommendations:    Insulin: Please consider ordering Novolog 3 units TID with meals for meal coverage if patient eats at least 50% of meals.  Thanks, Orlando Penner, RN, MSN, CDCES Diabetes Coordinator Inpatient Diabetes Program [phone] (Team Pager from 8am to 5pm)

## 2022-10-30 ENCOUNTER — Other Ambulatory Visit (HOSPITAL_COMMUNITY): Payer: Self-pay

## 2022-10-30 DIAGNOSIS — N400 Enlarged prostate without lower urinary tract symptoms: Secondary | ICD-10-CM | POA: Diagnosis not present

## 2022-10-30 DIAGNOSIS — I5031 Acute diastolic (congestive) heart failure: Secondary | ICD-10-CM | POA: Diagnosis not present

## 2022-10-30 DIAGNOSIS — E119 Type 2 diabetes mellitus without complications: Secondary | ICD-10-CM | POA: Diagnosis not present

## 2022-10-30 DIAGNOSIS — J810 Acute pulmonary edema: Secondary | ICD-10-CM

## 2022-10-30 DIAGNOSIS — N182 Chronic kidney disease, stage 2 (mild): Secondary | ICD-10-CM | POA: Diagnosis not present

## 2022-10-30 LAB — GLUCOSE, CAPILLARY
Glucose-Capillary: 174 mg/dL — ABNORMAL HIGH (ref 70–99)
Glucose-Capillary: 177 mg/dL — ABNORMAL HIGH (ref 70–99)

## 2022-10-30 LAB — BASIC METABOLIC PANEL: Potassium: 3.4 mmol/L — ABNORMAL LOW (ref 3.5–5.1)

## 2022-10-30 MED ORDER — EMPAGLIFLOZIN 25 MG PO TABS: 25.0000 mg | ORAL_TABLET | Freq: Every day | ORAL | 1 refills | Status: DC

## 2022-10-30 MED ORDER — AMLODIPINE BESY-BENAZEPRIL HCL 10-20 MG PO CAPS: 1.0000 | ORAL_CAPSULE | Freq: Every day | ORAL | 0 refills | Status: DC

## 2022-10-30 MED ORDER — HYDRALAZINE HCL 100 MG PO TABS: 100.0000 mg | ORAL_TABLET | Freq: Three times a day (TID) | ORAL | 0 refills | Status: DC

## 2022-10-30 MED ORDER — POTASSIUM CHLORIDE CRYS ER 20 MEQ PO TBCR
40.0000 meq | EXTENDED_RELEASE_TABLET | Freq: Once | ORAL | Status: AC
  Administered 2022-10-30: 40 meq via ORAL
  Filled 2022-10-30: qty 2

## 2022-10-30 MED ORDER — CARVEDILOL 25 MG PO TABS: 25.0000 mg | ORAL_TABLET | Freq: Two times a day (BID) | ORAL | 3 refills | Status: DC

## 2022-10-30 NOTE — Progress Notes (Signed)
Subjective:  Feels better.  Wife is present at the bedside.  Breathing is improved.  Wants to go home.   Current Facility-Administered Medications:    acetaminophen (TYLENOL) tablet 650 mg, 650 mg, Oral, Q6H PRN, 650 mg at 10/29/22 1258 **OR** acetaminophen (TYLENOL) suppository 650 mg, 650 mg, Rectal, Q6H PRN, Samuel Fail, MD   amLODipine (NORVASC) tablet 10 mg, 10 mg, Oral, Daily, Patwardhan, Manish J, MD, 10 mg at 10/30/22 0834   atorvastatin (LIPITOR) tablet 80 mg, 80 mg, Oral, QPM, Samuel Fail, MD, 80 mg at 10/29/22 1645   carvedilol (COREG) tablet 25 mg, 25 mg, Oral, BID WC, Patwardhan, Manish J, MD, 25 mg at 10/30/22 0834   enoxaparin (LOVENOX) injection 40 mg, 40 mg, Subcutaneous, Q24H, Samuel Fail, MD, 40 mg at 10/29/22 1456   fesoterodine (TOVIAZ) tablet 4 mg, 4 mg, Oral, Daily, Samuel Fail, MD, 4 mg at 10/30/22 9528   hydrALAZINE (APRESOLINE) tablet 100 mg, 100 mg, Oral, Q8H, Samuel Fail, MD, 100 mg at 10/30/22 0525   insulin aspart (novoLOG) injection 0-9 Units, 0-9 Units, Subcutaneous, TID WC, Samuel Fail, MD, 2 Units at 10/30/22 0835   isosorbide dinitrate (ISORDIL) tablet 20 mg, 20 mg, Oral, TID, Patwardhan, Manish J, MD, 20 mg at 10/30/22 0835   losartan (COZAAR) tablet 50 mg, 50 mg, Oral, Daily, Patwardhan, Manish J, MD, 50 mg at 10/30/22 0835   polyethylene glycol (MIRALAX / GLYCOLAX) packet 17 g, 17 g, Oral, Daily PRN, Samuel Fail, MD   sodium chloride flush (NS) 0.9 % injection 3 mL, 3 mL, Intravenous, Q12H, Samuel Fail, MD, 3 mL at 10/30/22 0835   tamsulosin (FLOMAX) capsule 0.4 mg, 0.4 mg, Oral, Daily, Samuel Fail, MD, 0.4 mg at 10/30/22 4132   Objective:  Vital Signs in the last 24 hours: Temp:  [97.9 F (36.6 C)-98.5 F (36.9 C)] 98.4 F (36.9 C) (07/26 0747) Pulse Rate:  [70-79] 77 (07/26 0747) Resp:  [16-19] 18 (07/26 0747) BP: (133-190)/(76-112) 190/103 (07/26 0747) SpO2:  [92 %-98 %] 96 %  (07/26 0747) Weight:  [107.5 kg] 107.5 kg (07/26 0437)  Intake/Output from previous day: 07/25 0701 - 07/26 0700 In: 847.1 [P.O.:840; I.V.:7.1] Out: 1460 [Urine:1460]  Physical Exam Vitals and nursing note reviewed.  Constitutional:      General: He is not in acute distress. Neck:     Vascular: No JVD.  Cardiovascular:     Rate and Rhythm: Normal rate and regular rhythm.     Heart sounds: Normal heart sounds. No murmur heard. Pulmonary:     Effort: Pulmonary effort is normal.     Breath sounds: Normal breath sounds. No wheezing or rales.  Musculoskeletal:     Right lower leg: Edema (Trace) present.     Left lower leg: Edema (Trace) present.     Imaging/tests reviewed and independently interpreted: CTA chest 10/26/2022: 1. No evidence of acute aortic syndrome. 2. No evidence of pulmonary embolus. 3. Mild interlobular septal thickening and diffuse ground-glass opacities with more focal right upper lobe ground-glass opacities, likely due to pulmonary edema. 4. Small bilateral pleural effusions. 5. Mildly enlarged mediastinal and hilar lymph nodes, likely reactive. 6. Solid pulmonary nodule of the right middle lobe measuring 4 mm. No follow-up needed if patient is low-risk.This recommendation follows the consensus statement: Guidelines for Management of Incidental Pulmonary Nodules Detected on CT Images: From the Fleischner Society 2017; Radiology 2017; 284:228-243. 7. Aortic valve calcifications. Findings can be seen in the  setting of aortic sclerosis or stenosis. Correlate with echocardiography. 8. Mild aortic Atherosclerosis (ICD10-I70.0).  Cardiac Studies:  Telemetry 10/29/2022: Sinus rhythm, no significant arrhythmia  EKG 10/26/2022: Sinus rhythm Atrial premature complex Probable left atrial enlargement Nonspecific T abnormalities, lateral leads    Echocardiogram 10/28/2022:  1. Left ventricular ejection fraction, by estimation, is 60 to 65%. The  left  ventricle has normal function. The left ventricle has no regional  wall motion abnormalities. There is moderate left ventricular hypertrophy.  Left ventricular diastolic parameters are consistent with Grade II diastolic dysfunction  (pseudonormalization).   2. Right ventricular systolic function is normal. The right ventricular  size is normal. There is normal pulmonary artery systolic pressure.   3. Left atrial size was mild to moderately dilated.   4. The mitral valve is grossly normal. Trivial mitral valve  regurgitation. No evidence of mitral stenosis.   5. Highly suspicious for bicuspid aortic valve, but cannot be completely  visualized. The aortic valve has an indeterminant number of cusps. There  is moderate calcification of the aortic valve. There is moderate  thickening of the aortic valve. Aortic  valve regurgitation is not visualized. Mild aortic valve stenosis.   6. Aortic dilatation noted. There is mild dilatation of the ascending  aorta, measuring 40 mm.   7. The inferior vena cava is normal in size with greater than 50%  respiratory variability, suggesting right atrial pressure of 3 mmHg.   Comparison(s): No prior Echocardiogram.   Conclusion(s)/Recommendation(s): High suspicion for bicuspid aortic valve,  with mild aortic stenosis and mild ascending aorta dilation.   Assessment & Recommendations:  Samuel Atkins is a 67 y.o. African American male with uncontrolled hypertension, type 2 diabetes mellitus, HFpEF  1.  Hypertensive urgency 2.  Acute diastolic heart failure now resolved 3.  Moderate obesity 4.  Diabetes mellitus with chronic kidney disease, stage IIIa  Recommendations: I had extensive discussion with the patient and his wife at the bedside regarding making lifestyle changes.  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  I also given instructions and patient education materials regarding heart  failure diet and hypertension diet as well.  I have increased his amlodipine/benazepril from 5/20 mg to 10/20 mg daily, will increase carvedilol to 25 mg twice daily and will also add Jardiance 25 mg daily for diastolic heart failure, hypertension, chronic kidney disease and diabetes mellitus.  Will reduce the dose of glipizide from 10 mg to 0.5 mg daily to avoid hypoglycemia.  Consider completely discontinuing this and consider outpatient initiation of Ozempic from cardiac standpoint and diabetes standpoint and weight loss.   Yates Decamp, MD, [facility] 10/30/2022, 6:20 PM Office: [phone] Fax: [phone] Pager: [phone]

## 2022-10-30 NOTE — Plan of Care (Signed)

## 2022-10-30 NOTE — Discharge Summary (Signed)
Physician Discharge Summary  Tung Dudoit ZOX:[id number] DOB: 1955/04/29 DOA: 10/26/2022  PCP: Jackie Plum, MD  Admit date: 10/26/2022 Discharge date: 10/30/2022  Admitted From: Home  Discharge disposition: Home   Recommendations for Outpatient Follow-Up:   Follow up with your primary care provider in one week.  Check CBC, BMP, magnesium in the next visit Follow-up with cardiology as outpatient on 11/27/2022, at 11:15 AM  Discharge Diagnosis:   Principal Problem:   CHF (congestive heart failure) (HCC) Active Problems:   Benign prostatic hyperplasia   Diabetes mellitus (HCC)   Hypertension   Chronic kidney disease   Discharge Condition: Improved.  Diet recommendation: Low sodium, heart healthy.    Wound care: None.  Code status: Full.   History of Present Illness:   Samuel Atkins is a 67 y.o. male with medical history significant of hypertension, diabetes, BPH, CKD, depression, overactive bladder presented to the hospital with  2 weeks of increasing shortness of breath especially on exertion with lower extremity edema, dyspnea.  No previous history of congestive heart failure. Called his PCP who recommended patient get evaluated in the ED.   In the ED, vitals were notable for blood pressure in the 140s to 220s systolic, respiratory rate in the  20s.  Lab workup included CMP with potassium 3.1, creatinine 1.26, glucose 163, calcium 8.4.  CBC with hemoglobin stable at 11.2.  Troponin flat at 21 x 2.  BNP elevated to 436.  UDS negative.  Chest x-ray  showed evidence of CHF/pulmonary edema, bilateral lower lobe opacities possibly representing atelectasis versus pneumonia.  CTA of the chest abdomen and pelvis showed no acute aortic syndrome, no PE but consistent with pulmonary edema.  Small pleural effusions also noted.  Mild mediastinal lymphadenopathy noted by suspected to be reactive.  Solitary nodule of 4 mm noted with no recommendation recommended if patient  is low risk.  Patient was initiated on nitroglycerin drip due to continued  elevated blood pressure and was admitted hospital for further evaluation.    Hospital Course:   Following conditions were addressed during hospitalization as listed below,  New onset acute diastolic CHF/ Hypertensive emergency Had pulmonary edema on imaging.  BNP elevated to 436.  Troponin flat at 21 x 2.  Significantly elevated blood pressure in the 170s to 220s in the ED.   2D echocardiogram with LV ejection fraction of 60 to 65% with grade 2 diastolic dysfunction. Cardiology was consulted and gradually nitroglycerin drip was discontinued.  Patient has been adjusted on Lotrel dose from home, hydralazine 3 times daily..  Patient has been restarted on Coreg and started on Jardiance as per cardiology.    At this time blood pressure has significantly improved.  Hypokalemia Replenished with potassium.  Will need to monitor as outpatient.   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  Most recent creatinine visible at [facility] records is 1.36 from 2022.  Patient presented with creatinine of 1.2, creatinine prior to discharge was 1.4.    Diabetes mellitus type II with hyperglycemia.   Patient is on glipizide and metformin at home.   Hemoglobin A1c of 7.7. Has been started on Jardiace as well.   BPH Continue  tamsulosin   Obesity.  Body mass index is 37.62 kg/m.  Patient would benefit from continued weight loss as outpatient.   Obstructive sleep apnea.  Patient uses CPAP at home.   Encouraged him to use his CPAP at home.  Disposition.  At this time, patient is stable for disposition home  with outpatient PCP and cardiology follow-up.  Medical Consultants:   Neurology  Procedures:    None Subjective:   Today, patient was seen and examined at bedside.  Feels better, blood pressure has improved.  Improved shortness of breath.  Denies dyspnea chest pain dizziness lightheadedness.  Discharge Exam:   Vitals:   10/30/22 0600 10/30/22  0747  BP:  (!) 190/103  Pulse:  77  Resp: 16 18  Temp:  98.4 F (36.9 C)  SpO2: 98% 96%   Vitals:   10/30/22 0400 10/30/22 0437 10/30/22 0600 10/30/22 0747  BP:  (!) 142/76  (!) 190/103  Pulse:  76  77  Resp: 18 17 16 18   Temp:  98.5 F (36.9 C)  98.4 F (36.9 C)  TempSrc:  Oral  Oral  SpO2: 96% 92% 98% 96%  Weight:  107.5 kg    Height:      Body mass index is 37.14 kg/m.   General: Alert awake, not in obvious distress, obese HENT: pupils equally reacting to light,  No scleral pallor or icterus noted. Oral mucosa is moist.  Chest:    Diminished breath sounds bilaterally. CVS: S1 &S2 heard. No murmur.  Regular rate and rhythm. Abdomen: Soft, nontender, nondistended.  Bowel sounds are heard.   Extremities: No cyanosis, clubbing trace lower extremity edema peripheral pulses are palpable. Psych: Alert, awake and oriented, normal mood CNS:  No cranial nerve deficits.  Power equal in all extremities.   Skin: Warm and dry.  No rashes noted.  The results of significant diagnostics from this hospitalization (including imaging, microbiology, ancillary and laboratory) are listed below for reference.     Diagnostic Studies:   CT Angio Chest/Abd/Pel for Dissection W and/or Wo Contrast  Result Date: 10/26/2022 CLINICAL DATA:  Hypertension and shortness of breath EXAM: CT ANGIOGRAPHY CHEST, ABDOMEN AND PELVIS TECHNIQUE: Non-contrast CT of the chest was initially obtained. Multidetector CT imaging through the chest, abdomen and pelvis was performed using the standard protocol during bolus administration of intravenous contrast. Multiplanar reconstructed images and MIPs were obtained and reviewed to evaluate the vascular anatomy. RADIATION DOSE REDUCTION: This exam was performed according to the departmental dose-optimization program which includes automated exposure control, adjustment of the mA and/or kV according to patient size and/or use of iterative reconstruction technique. CONTRAST:   OMNIPAQUE IOHEXOL 350 MG/ML SOLN COMPARISON:  None Available. FINDINGS: CTA CHEST FINDINGS Cardiovascular: Normal heart size. Trace pericardial effusion. No visible coronary artery calcifications. No evidence of pulmonary embolus. Aortic valve calcifications. Mediastinum/Nodes: Esophagus and thyroid are unremarkable. Mildly enlarged mediastinal and hilar lymph nodes, likely reactive. Reference subcarinal lymph node measuring 12 mm in short axis on series 7, image 54. Reference right hilar lymph node measuring 13 mm in short axis on image 45. Lungs/Pleura: Central airways are patent. Mild interlobular septal thickening. Mild diffuse ground-glass opacities with more focal mild right upper lobe ground-glass opacities. Solid pulmonary nodule of the right middle lobe measuring 4 mm on series 8, image 34. Small bilateral pleural effusions. Musculoskeletal: No chest wall abnormality. No acute or significant osseous findings. Review of the MIP images confirms the above findings. CTA ABDOMEN AND PELVIS FINDINGS VASCULAR Aorta: Normal caliber thoracic and abdominal aorta mild atherosclerotic disease. No evidence of acute aortic syndrome. Standard three-vessel aortic arch with no significant stenosis. Celiac: Patent.  No evidence of aneurysm, dissection or stenosis. SMA: Patent. No evidence of aneurysm, dissection or stenosis. Renals: Bilateral renal arteries are patent. No evidence of aneurysm, dissection or stenosis.  IMA: Patent. No evidence of aneurysm, dissection or stenosis. Inflow: Patent. No evidence of aneurysm, dissection or stenosis. Veins: No obvious venous abnormality within the limitations of this arterial phase study. Review of the MIP images confirms the above findings. NON-VASCULAR Hepatobiliary: No focal liver abnormality is seen. No gallstones, gallbladder wall thickening, or biliary dilatation. Pancreas: Unremarkable. No pancreatic ductal dilatation or surrounding inflammatory changes. Spleen: Normal  in size without focal abnormality. Adrenals/Urinary Tract: Bilateral adrenal glands are unremarkable. No hydronephrosis or nephrolithiasis. Bladder is unremarkable. Stomach/Bowel: Stomach is within normal limits. Appendix appears normal. Mild diverticulosis. No evidence of bowel wall thickening, distention, or inflammatory changes. Lymphatic: No enlarged lymph nodes seen in the abdomen or pelvis. Reproductive: Prostate is unremarkable. Other: Small fat containing periumbilical hernia. No abdominopelvic ascites. Musculoskeletal: No acute or significant osseous findings. Review of the MIP images confirms the above findings. IMPRESSION: 1. No evidence of acute aortic syndrome. 2. No evidence of pulmonary embolus. 3. Mild interlobular septal thickening and diffuse ground-glass opacities with more focal right upper lobe ground-glass opacities, likely due to pulmonary edema. 4. Small bilateral pleural effusions. 5. Mildly enlarged mediastinal and hilar lymph nodes, likely reactive. 6. Solid pulmonary nodule of the right middle lobe measuring 4 mm. No follow-up needed if patient is low-risk.This recommendation follows the consensus statement: Guidelines for Management of Incidental Pulmonary Nodules Detected on CT Images: From the Fleischner Society 2017; Radiology 2017; 284:228-243. 7. Aortic valve calcifications. Findings can be seen in the setting of aortic sclerosis or stenosis. Correlate with echocardiography. 8. Mild aortic Atherosclerosis (ICD10-I70.0). Electronically Signed   By: Allegra Lai M.D.   On: 10/26/2022 16:37   DG Chest 2 View  Result Date: 10/26/2022 CLINICAL DATA:  SOB EXAM: CHEST - 2 VIEW COMPARISON:  None Available. FINDINGS: There is central vascular congestion along with diffuse ground-glass changes throughout bilateral lungs, concerning for congestive heart failure/pulmonary edema. There are additional opacities in the bilateral lower lobes, which may represent superimposed pneumonia  versus atelectasis. Correlate clinically. Bilateral costophrenic angles are clear. Mildly enlarged cardio-mediastinal silhouette. No acute osseous abnormalities. The soft tissues are within normal limits. IMPRESSION: 1. Congestive heart failure/pulmonary edema. 2. Bilateral lower lobe opacities, which may represent superimposed pneumonia versus atelectasis. Electronically Signed   By: Jules Schick M.D.   On: 10/26/2022 14:43     Labs:   Basic Metabolic Panel: Recent Labs  Lab 10/26/22 1346 10/26/22 1422 10/27/22 0805 10/28/22 0229 10/29/22 0011 10/30/22 0139  NA 138  --  138 139 134* 135  K 3.1*  --  3.3* 3.2* 3.6 3.4*  CL 103  --  101 101 100 100  CO2 25  --  27 25 24 26   GLUCOSE 165*  --  230* 183* 184* 187*  BUN 18  --  17 14 18 16   CREATININE 1.26*  --  1.40* 1.62* 1.61* 1.44*  CALCIUM 8.4*  --  8.5* 8.5* 8.6* 8.6*  MG  --  2.0  --   --  1.9 2.0   GFR Estimated Creatinine Clearance: 58.2 mL/min (A) (by C-G formula based on SCr of 1.44 mg/dL (H)). Liver Function Tests: Recent Labs  Lab 10/26/22 1346  AST 27  ALT 35  ALKPHOS 81  BILITOT 0.4  PROT 6.6  ALBUMIN 3.5   No results for input(s): "LIPASE", "AMYLASE" in the last 168 hours. No results for input(s): "AMMONIA" in the last 168 hours. Coagulation profile No results for input(s): "INR", "PROTIME" in the last 168 hours.  CBC: Recent  Labs  Lab 10/26/22 1346 10/27/22 0805 10/28/22 0229 10/29/22 0011 10/30/22 0139  WBC 6.8 7.8 9.9 8.7 6.4  NEUTROABS  --  5.3  --   --   --   HGB 11.3* 11.2* 10.4* 10.7* 11.1*  HCT 33.5* 33.3* 31.2* 31.9* 33.0*  MCV 94.6 93.8 93.1 96.1 96.2  PLT 223 237 224 227 189   Cardiac Enzymes: No results for input(s): "CKTOTAL", "CKMB", "CKMBINDEX", "TROPONINI" in the last 168 hours. BNP: Invalid input(s): "POCBNP" CBG: Recent Labs  Lab 10/29/22 0745 10/29/22 1234 10/29/22 1609 10/30/22 0152 10/30/22 0741  GLUCAP 167* 193* 233* 174* 177*   D-Dimer No results for  input(s): "DDIMER" in the last 72 hours. Hgb A1c Recent Labs    10/27/22 1837  HGBA1C 7.7*   Lipid Profile No results for input(s): "CHOL", "HDL", "LDLCALC", "TRIG", "CHOLHDL", "LDLDIRECT" in the last 72 hours. Thyroid function studies No results for input(s): "TSH", "T4TOTAL", "T3FREE", "THYROIDAB" in the last 72 hours.  Invalid input(s): "FREET3" Anemia work up No results for input(s): "VITAMINB12", "FOLATE", "FERRITIN", "TIBC", "IRON", "RETICCTPCT" in the last 72 hours. Microbiology No results found for this or any previous visit (from the past 240 hour(s)).   Discharge Instructions:   Discharge Instructions     Diet - low sodium heart healthy   Complete by: As directed    Diet Carb Modified   Complete by: As directed    Discharge instructions   Complete by: As directed    Follow-up with your primary care provider in 1 week.  Check blood work at that time.  Follow-up with cardiology in 1 to 2 weeks as scheduled by the clinic. Please take all medications as prescribed.  No overexertion. Seek medical attention for worsening symptoms.   Increase activity slowly   Complete by: As directed       Allergies as of 10/30/2022   No Known Allergies      Medication List     STOP taking these medications    amLODipine-benazepril 5-40 MG capsule Commonly known as: LOTREL Replaced by: amLODipine-benazepril 10-20 MG capsule   losartan 50 MG tablet Commonly known as: COZAAR   meloxicam 15 MG tablet Commonly known as: MOBIC   meloxicam 7.5 MG tablet Commonly known as: Mobic       TAKE these medications    Alogliptin Benzoate 25 MG Tabs Take 25 mg by mouth daily.   amLODipine-benazepril 10-20 MG capsule Commonly known as: Lotrel Take 1 capsule by mouth daily. Replaces: amLODipine-benazepril 5-40 MG capsule   atorvastatin 40 MG tablet Commonly known as: LIPITOR Take 40 mg by mouth every evening.   benzonatate 100 MG capsule Commonly known as: TESSALON Take 1  capsule (100 mg total) by mouth every 8 (eight) hours. What changed:  when to take this reasons to take this   carvedilol 25 MG tablet Commonly known as: COREG Take 1 tablet (25 mg total) by mouth 2 (two) times daily with a meal. What changed:  medication strength how much to take Another medication with the same name was removed. Continue taking this medication, and follow the directions you see here.   diclofenac Sodium 1 % Gel Commonly known as: VOLTAREN Apply 2 g topically daily as needed (pain).   empagliflozin 25 MG Tabs tablet Commonly known as: Jardiance Take 1 tablet (25 mg total) by mouth daily.   glipiZIDE 10 MG tablet Commonly known as: GLUCOTROL Take 0.5 tablets (5 mg total) by mouth daily before breakfast. What changed: how much to  take   hydrALAZINE 100 MG tablet Commonly known as: APRESOLINE Take 1 tablet (100 mg total) by mouth 3 (three) times daily.   metFORMIN 1000 MG tablet Commonly known as: GLUCOPHAGE Take 500 mg by mouth 2 (two) times daily with a meal.   sildenafil 20 MG tablet Commonly known as: REVATIO Take 20 mg by mouth daily as needed (erectile dysfunction).   sitaGLIPtin 50 MG tablet Commonly known as: JANUVIA Take 50 mg by mouth daily as needed (Blood sugar over 200).   tamsulosin 0.4 MG Caps capsule Commonly known as: FLOMAX Take 0.4 mg by mouth daily.        Follow-up Information     Osei-Bonsu, Greggory Stallion, MD Follow up in 1 week(s).   Specialty: Internal Medicine Why: routine check up and blood work Contact information: [address] [place] [address] [phone]         Elder Negus, MD Follow up on 11/27/2022.   Specialties: Cardiology, Radiology Why: 11:15 AM Contact information: [address] [place] [address] [phone]                  Time coordinating discharge: 39 minutes  Signed:  Maycen Degregory  [facility] 10/30/2022, 11:50  AM

## 2022-10-30 NOTE — Care Management Important Message (Signed)
Important Message  Patient Details  Name: Samuel Atkins MRN: [id number] Date of Birth: [birth year]   Medicare Important Message Given:  Yes     Renie Ora 10/30/2022, 9:08 AM

## 2022-11-11 ENCOUNTER — Other Ambulatory Visit (HOSPITAL_COMMUNITY): Payer: Self-pay | Admitting: Cardiology

## 2022-11-11 DIAGNOSIS — E2609 Other primary hyperaldosteronism: Secondary | ICD-10-CM

## 2022-11-11 DIAGNOSIS — I152 Hypertension secondary to endocrine disorders: Secondary | ICD-10-CM

## 2022-11-11 NOTE — Progress Notes (Signed)
High levels of aldosterone hormone could be causing your hypertension. I will place a referral for you to see endocrinology.  Thanks MJP

## 2022-11-12 NOTE — Progress Notes (Signed)
Called and spoke with patient regarding his recent lab results. Patient will follow up with Endocrinology and will wait for their call for scheduling an appointment.

## 2022-11-27 ENCOUNTER — Encounter: Payer: Self-pay | Admitting: Cardiology

## 2022-11-27 ENCOUNTER — Ambulatory Visit: Payer: Medicare Other | Admitting: Cardiology

## 2022-11-27 VITALS — BP 127/72 | HR 71 | Resp 16 | Ht 67.0 in | Wt 235.0 lb

## 2022-11-27 DIAGNOSIS — I10 Essential (primary) hypertension: Secondary | ICD-10-CM

## 2022-11-27 DIAGNOSIS — I350 Nonrheumatic aortic (valve) stenosis: Secondary | ICD-10-CM | POA: Insufficient documentation

## 2022-11-27 MED ORDER — HYDRALAZINE HCL 100 MG PO TABS: 100.0000 mg | ORAL_TABLET | Freq: Three times a day (TID) | ORAL | 3 refills | Status: AC

## 2022-11-27 MED ORDER — AMLODIPINE BESY-BENAZEPRIL HCL 10-20 MG PO CAPS: 1.0000 | ORAL_CAPSULE | Freq: Every day | ORAL | 3 refills | Status: DC

## 2022-11-27 MED ORDER — ATORVASTATIN CALCIUM 40 MG PO TABS: 40.0000 mg | ORAL_TABLET | Freq: Every evening | ORAL | 3 refills | Status: DC

## 2022-11-27 MED ORDER — CARVEDILOL 25 MG PO TABS: 25.0000 mg | ORAL_TABLET | Freq: Two times a day (BID) | ORAL | 3 refills | Status: AC

## 2022-11-27 NOTE — Progress Notes (Signed)
Follow up visit  Subjective:   Samuel Atkins, male    DOB: May 24, 1955, 67 y.o.   MRN: [id number]    HPI  Chief Complaint  Patient presents with   Hypertension    67 y.o. African American male with uncontrolled hypertension, type 2 diabetes mellitus, HFpEF, mild AS with likely bicuspid aortic valve.   Patient was hospitalized in 10/2022 with hypertensive urgency. Blood pressure was difficult to control. Aldosterone normal, renin was low. Blood pressure is now very well controlled. He is complaint with medical therapy.     Current Outpatient Medications:    Alogliptin Benzoate 25 MG TABS, Take 25 mg by mouth daily., Disp: , Rfl:    amLODipine-benazepril (LOTREL) 10-20 MG capsule, Take 1 capsule by mouth daily., Disp: 90 capsule, Rfl: 0   atorvastatin (LIPITOR) 40 MG tablet, Take 40 mg by mouth every evening., Disp: , Rfl:    benzonatate (TESSALON) 100 MG capsule, Take 1 capsule (100 mg total) by mouth every 8 (eight) hours. (Patient taking differently: Take 100 mg by mouth 3 (three) times daily as needed for cough.), Disp: 21 capsule, Rfl: 0   carvedilol (COREG) 25 MG tablet, Take 1 tablet (25 mg total) by mouth 2 (two) times daily with a meal., Disp: 180 tablet, Rfl: 3   diclofenac Sodium (VOLTAREN) 1 % GEL, Apply 2 g topically daily as needed (pain)., Disp: , Rfl:    empagliflozin (JARDIANCE) 25 MG TABS tablet, Take 1 tablet (25 mg total) by mouth daily., Disp: 90 tablet, Rfl: 1   glipiZIDE (GLUCOTROL) 10 MG tablet, Take 0.5 tablets (5 mg total) by mouth daily before breakfast., Disp: , Rfl:    hydrALAZINE (APRESOLINE) 100 MG tablet, Take 1 tablet (100 mg total) by mouth 3 (three) times daily., Disp: 90 tablet, Rfl: 0   metFORMIN (GLUCOPHAGE) 1000 MG tablet, Take 500 mg by mouth 2 (two) times daily with a meal., Disp: , Rfl:    sildenafil (REVATIO) 20 MG tablet, Take 20 mg by mouth daily as needed (erectile dysfunction)., Disp: , Rfl:    sitaGLIPtin (JANUVIA) 50 MG tablet, Take  50 mg by mouth daily as needed (Blood sugar over 200)., Disp: , Rfl:    tamsulosin (FLOMAX) 0.4 MG CAPS capsule, Take 0.4 mg by mouth daily., Disp: , Rfl:    Cardiovascular & other pertient studies:  Reviewed external labs and tests, independently interpreted  EKG 10/27/2022: Sinus rhythm Atrial premature complex Probable left atrial enlargement Nonspecific T abnormalities, lateral leads  Echocardiogram 10/28/2022: 1. Left ventricular ejection fraction, by estimation, is 60 to 65%. The  left ventricle has normal function. The left ventricle has no regional  wall motion abnormalities. There is moderate left ventricular hypertrophy.  Left ventricular diastolic  parameters are consistent with Grade II diastolic dysfunction  (pseudonormalization).   2. Right ventricular systolic function is normal. The right ventricular  size is normal. There is normal pulmonary artery systolic pressure.   3. Left atrial size was mild to moderately dilated.   4. The mitral valve is grossly normal. Trivial mitral valve  regurgitation. No evidence of mitral stenosis.   5. Highly suspicious for bicuspid aortic valve, but cannot be completely  visualized. The aortic valve has an indeterminant number of cusps. There  is moderate calcification of the aortic valve. There is moderate  thickening of the aortic valve. Aortic  valve regurgitation is not visualized. Mild aortic valve stenosis.   6. Aortic dilatation noted. There is mild dilatation of the ascending  aorta, measuring 40 mm.   7. The inferior vena cava is normal in size with greater than 50%  respiratory variability, suggesting right atrial pressure of 3 mmHg.   Comparison(s): No prior Echocardiogram.   CTA chest 10/26/2022: 1. No evidence of acute aortic syndrome. 2. No evidence of pulmonary embolus. 3. Mild interlobular septal thickening and diffuse ground-glass opacities with more focal right upper lobe ground-glass opacities, likely due to  pulmonary edema. 4. Small bilateral pleural effusions. 5. Mildly enlarged mediastinal and hilar lymph nodes, likely reactive. 6. Solid pulmonary nodule of the right middle lobe measuring 4 mm. No follow-up needed if patient is low-risk.This recommendation follows the consensus statement: Guidelines for Management of Incidental Pulmonary Nodules Detected on CT Images: From the Fleischner Society 2017; Radiology 2017; 284:228-243. 7. Aortic valve calcifications. Findings can be seen in the setting of aortic sclerosis or stenosis. Correlate with echocardiography. 8. Mild aortic Atherosclerosis (ICD10-I70.0).    Recent labs: 10/30/2022: Glucose 187, BUN/Cr 16/1.44. EGFR 53. Na/K 135/3.4. Rest of the CMP normal H/H 11/33. MCV 96. Platelets 189 HbA1C 7.7%  Latest Reference Range & Units 10/28/22 14:00  ALDOSTERONE 0.0 - 30.0 ng/dL 95.6  PRA LC/MS/MS 2.130 - 5.380 ng/mL/hr <0.167 (L)  ALDO / PRA Ratio 0.0 - 30.0  >133.5 (H)  (L): Data is abnormally low (H): Data is abnormally high   Review of Systems  Cardiovascular:  Negative for chest pain, dyspnea on exertion, leg swelling, palpitations and syncope.         Vitals:   11/27/22 1103  BP: 127/72  Pulse: 71  Resp: 16  SpO2: 97%    Body mass index is 36.81 kg/m. Filed Weights   11/27/22 1103  Weight: 235 lb (106.6 kg)     Objective:   Physical Exam Vitals and nursing note reviewed.  Constitutional:      General: He is not in acute distress. Neck:     Vascular: No JVD.  Cardiovascular:     Rate and Rhythm: Normal rate and regular rhythm.     Heart sounds: Normal heart sounds. No murmur heard. Pulmonary:     Effort: Pulmonary effort is normal.     Breath sounds: Normal breath sounds. No wheezing or rales.  Musculoskeletal:     Right lower leg: No edema.     Left lower leg: No edema.             Visit diagnoses:   ICD-10-CM   1. Nonrheumatic aortic valve stenosis  I35.0 PCV ECHOCARDIOGRAM COMPLETE     2. Primary hypertension  I10        Orders Placed This Encounter  Procedures   PCV ECHOCARDIOGRAM COMPLETE     Meds ordered this encounter  Medications   hydrALAZINE (APRESOLINE) 100 MG tablet    Sig: Take 1 tablet (100 mg total) by mouth 3 (three) times daily.    Dispense:  90 tablet    Refill:  3   carvedilol (COREG) 25 MG tablet    Sig: Take 1 tablet (25 mg total) by mouth 2 (two) times daily with a meal.    Dispense:  180 tablet    Refill:  3   amLODipine-benazepril (LOTREL) 10-20 MG capsule    Sig: Take 1 capsule by mouth daily.    Dispense:  90 capsule    Refill:  3   atorvastatin (LIPITOR) 40 MG tablet    Sig: Take 1 tablet (40 mg total) by mouth every evening.    Dispense:  90  tablet    Refill:  3     Assessment & Recommendations:   67 y.o. African American male with uncontrolled hypertension, type 2 diabetes mellitus, HFpEF, mild AS with likely bicuspid aortic valve.    Hypertension:  Normal aldosterone, but very low renin with elevated renin/aldosterone ratio. Blood pressure is now very well-controlled today on amlodipine/benazepril 10-20 mg daily, carvedilol 25 mg twice daily, hydralazine 100 mg 3 times daily. Continue the same.  Bicuspid aortic valve: Mild stenosis with mild TAA. Continue beta blocker, ARB. Repeat echocardiogram in 09/2023.  F/u in 3 months     Elder Negus, MD Pager: [phone] Office: [phone]

## 2022-12-09 DIAGNOSIS — H5213 Myopia, bilateral: Secondary | ICD-10-CM | POA: Diagnosis not present

## 2022-12-09 DIAGNOSIS — H40023 Open angle with borderline findings, high risk, bilateral: Secondary | ICD-10-CM | POA: Diagnosis not present

## 2022-12-09 DIAGNOSIS — H1820 Unspecified corneal edema: Secondary | ICD-10-CM | POA: Diagnosis not present

## 2022-12-09 DIAGNOSIS — H524 Presbyopia: Secondary | ICD-10-CM | POA: Diagnosis not present

## 2022-12-09 DIAGNOSIS — E119 Type 2 diabetes mellitus without complications: Secondary | ICD-10-CM | POA: Diagnosis not present

## 2022-12-14 ENCOUNTER — Ambulatory Visit: Payer: Medicare Other | Admitting: Cardiology

## 2023-01-25 ENCOUNTER — Other Ambulatory Visit: Payer: Self-pay | Admitting: Cardiology

## 2023-02-18 DIAGNOSIS — M19041 Primary osteoarthritis, right hand: Secondary | ICD-10-CM | POA: Diagnosis not present

## 2023-02-18 DIAGNOSIS — E1151 Type 2 diabetes mellitus with diabetic peripheral angiopathy without gangrene: Secondary | ICD-10-CM | POA: Diagnosis not present

## 2023-02-18 DIAGNOSIS — Z23 Encounter for immunization: Secondary | ICD-10-CM | POA: Diagnosis not present

## 2023-02-18 DIAGNOSIS — E559 Vitamin D deficiency, unspecified: Secondary | ICD-10-CM | POA: Diagnosis not present

## 2023-02-18 DIAGNOSIS — Z125 Encounter for screening for malignant neoplasm of prostate: Secondary | ICD-10-CM | POA: Diagnosis not present

## 2023-02-18 DIAGNOSIS — I119 Hypertensive heart disease without heart failure: Secondary | ICD-10-CM | POA: Diagnosis not present

## 2023-02-18 DIAGNOSIS — E1129 Type 2 diabetes mellitus with other diabetic kidney complication: Secondary | ICD-10-CM | POA: Diagnosis not present

## 2023-02-18 DIAGNOSIS — Z0001 Encounter for general adult medical examination with abnormal findings: Secondary | ICD-10-CM | POA: Diagnosis not present

## 2023-02-18 DIAGNOSIS — E782 Mixed hyperlipidemia: Secondary | ICD-10-CM | POA: Diagnosis not present

## 2023-02-18 DIAGNOSIS — R809 Proteinuria, unspecified: Secondary | ICD-10-CM | POA: Diagnosis not present

## 2023-03-18 DIAGNOSIS — R809 Proteinuria, unspecified: Secondary | ICD-10-CM | POA: Diagnosis not present

## 2023-03-18 DIAGNOSIS — I119 Hypertensive heart disease without heart failure: Secondary | ICD-10-CM | POA: Diagnosis not present

## 2023-03-18 DIAGNOSIS — E559 Vitamin D deficiency, unspecified: Secondary | ICD-10-CM | POA: Diagnosis not present

## 2023-03-18 DIAGNOSIS — E782 Mixed hyperlipidemia: Secondary | ICD-10-CM | POA: Diagnosis not present

## 2023-03-18 DIAGNOSIS — E1151 Type 2 diabetes mellitus with diabetic peripheral angiopathy without gangrene: Secondary | ICD-10-CM | POA: Diagnosis not present

## 2023-03-18 DIAGNOSIS — E1129 Type 2 diabetes mellitus with other diabetic kidney complication: Secondary | ICD-10-CM | POA: Diagnosis not present

## 2023-04-12 ENCOUNTER — Other Ambulatory Visit: Payer: Self-pay

## 2023-04-12 DIAGNOSIS — E2609 Other primary hyperaldosteronism: Secondary | ICD-10-CM

## 2023-04-13 ENCOUNTER — Other Ambulatory Visit: Payer: Medicare Other

## 2023-04-13 DIAGNOSIS — E2609 Other primary hyperaldosteronism: Secondary | ICD-10-CM | POA: Diagnosis not present

## 2023-04-18 LAB — ALDOSTERONE + RENIN ACTIVITY W/ RATIO
ALDO / PRA Ratio: 58.3 {ratio} — ABNORMAL HIGH (ref 0.9–28.9)
Aldosterone: 7 ng/dL
Renin Activity: 0.12 ng/mL/h — ABNORMAL LOW (ref 0.25–5.82)

## 2023-04-18 LAB — DHEA-SULFATE: DHEA-SO4: 178 ug/dL (ref 20–217)

## 2023-04-18 LAB — METANEPHRINES, PLASMA
Metanephrine, Free: 42 pg/mL (ref <=57)
Normetanephrine, Free: 148 pg/mL (ref <=148)
Total Metanephrines-Plasma: 190 pg/mL (ref <=205)

## 2023-04-18 LAB — CORTISOL: Cortisol, Plasma: 14.7 ug/dL

## 2023-04-18 LAB — ACTH: C206 ACTH: 45 pg/mL (ref 6–50)

## 2023-04-20 ENCOUNTER — Ambulatory Visit (INDEPENDENT_AMBULATORY_CARE_PROVIDER_SITE_OTHER): Payer: Medicare Other | Admitting: "Endocrinology

## 2023-04-20 VITALS — BP 120/70 | HR 78 | Ht 67.0 in | Wt 228.0 lb

## 2023-04-20 DIAGNOSIS — Z0130 Encounter for examination of blood pressure without abnormal findings: Secondary | ICD-10-CM

## 2023-04-20 NOTE — Progress Notes (Signed)
 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   I have reviewed current medications, nurse's notes, allergies, vital signs, past medical and surgical history, family medical history, and social history for this encounter. Counseled patient on symptoms, examination findings, lab findings, imaging results, treatment decisions and monitoring and prognosis. The patient understood the recommendations and agrees with the treatment plan. All questions regarding treatment plan were fully answered.  Samuel Birmingham, MD  04/20/23   History of Present Illness HPI  Samuel Atkins is a 68 y.o. male  referred by Dr. 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   He weight change Yes, some drop moon face No fat pads Yes, mild dorsocervical only  increased girth Yes plethora No hyperpigmentation No purple striae No proximal muscle weakness No acne No scalp hair loss No a history of HTN a history of hypokalemia Yes, mild paroxysmal episodes of anxiety Yes tremors No lightheadedness Yes,  sometimes headache No palpitation No sweating No blurry vision No  Component     Latest Ref Rng 04/13/2023  Metanephrine, Pl     <=57 pg/mL 42   Normetanephrine, Pl     <=148 pg/mL 148   Total Metanephrines-Plasma     <=205 pg/mL 190   ALDOSTERONE      ng/dL 7   Renin Activity     0.25 - 5.82 ng/mL/h 0.12 (L)   ALDO / PRA Ratio     0.9 - 28.9 Ratio 58.3 (H)   DHEA-SO4     20 - 217 mcg/dL 821   R793 ACTH      6 - 50 pg/mL 45   Cortisol, Plasma     mcg/dL 85.2     Legend: (L) Low (H) High   Physical Exam  BP 120/70   Pulse 78   Ht 5' 7 (1.702 m)   Wt 228 lb (103.4 kg)   SpO2 97%   BMI 35.71 kg/m    Constitutional: well developed, well nourished Head: normocephalic, atraumatic Eyes: sclera anicteric, no redness Neck: supple Lungs: normal respiratory effort Neurology: alert and oriented Skin: dry, no appreciable rashes Musculoskeletal: no appreciable defects Psychiatric: normal mood and affect   Current Medications Patient's Medications  New Prescriptions   No medications on file  Previous Medications   ALOGLIPTIN BENZOATE 25 MG TABS    Take 25 mg by mouth daily.   AMLODIPINE -BENAZEPRIL  (LOTREL) 10-20 MG CAPSULE    Take 1 capsule by mouth daily.   ATORVASTATIN  (LIPITOR) 40 MG TABLET    Take 1 tablet (40 mg total) by mouth every evening.   CARVEDILOL  (COREG ) 25 MG TABLET    Take 1 tablet (25 mg total) by mouth 2 (two) times daily with  a meal.   DICLOFENAC SODIUM (VOLTAREN) 1 % GEL    Apply 2 g topically daily as needed (pain).   EMPAGLIFLOZIN  (JARDIANCE ) 25 MG TABS TABLET    Take 1 tablet (25 mg total) by mouth daily.   GLIPIZIDE  (GLUCOTROL ) 10 MG TABLET    Take 0.5 tablets (5 mg total) by mouth daily before breakfast.   HYDRALAZINE  (APRESOLINE ) 100 MG TABLET    Take 1 tablet (100 mg total) by mouth 3 (three) times daily.   METFORMIN  (GLUCOPHAGE ) 1000 MG TABLET    Take 500 mg by mouth 2 (two) times daily with a meal.   SILDENAFIL (REVATIO) 20 MG TABLET     Take 20 mg by mouth daily as needed (erectile dysfunction).   SITAGLIPTIN (JANUVIA) 50 MG TABLET    Take 50 mg by mouth daily as needed (Blood sugar over 200).   TAMSULOSIN  (FLOMAX ) 0.4 MG CAPS CAPSULE    Take 0.4 mg by mouth daily.  Modified Medications   No medications on file  Discontinued Medications   No medications on file    Allergies No Known Allergies  Past Medical History Past Medical History:  Diagnosis Date   Diabetes mellitus without complication (HCC)    Hypertension     Past Surgical History No past surgical history on file.  Family History family history includes Diabetes in his brother, brother, father, sister, sister, and sister; Heart attack in his brother; Heart failure in his father; Hypertension in his father; SIDS in his brother; Stroke in his mother.  Social History Social History   Socioeconomic History   Marital status: Married    Spouse name: Not on file   Number of children: 5   Years of education: Not on file   Highest education level: Not on file  Occupational History   Not on file  Tobacco Use   Smoking status: Never   Smokeless tobacco: Never  Vaping Use   Vaping status: Never Used  Substance and Sexual Activity   Alcohol use: Yes    Comment: socially   Drug use: Not Currently   Sexual activity: Not on file  Other Topics Concern   Not on file  Social History Narrative   Not on file   Social Drivers of Health   Financial Resource Strain: Not on file  Food Insecurity: No Food Insecurity (10/27/2022)   Hunger Vital Sign    Worried About Running Out of Food in the Last Year: Never true    Ran Out of Food in the Last Year: Never true  Transportation Needs: No Transportation Needs (10/27/2022)   PRAPARE - Administrator, Civil Service (Medical): No    Lack of Transportation (Non-Medical): No  Physical Activity: Not on file  Stress: Not on file  Social Connections: Not on file  Intimate Partner Violence: Not At Risk  (10/27/2022)   Humiliation, Afraid, Rape, and Kick questionnaire    Fear of Current or Ex-Partner: No    Emotionally Abused: No    Physically Abused: No    Sexually Abused: No    No results found for: CHOL No results found for: HDL No results found for: LDLCALC No results found for: TRIG No results found for: [facility] Lab Results  Component Value Date   CREATININE 1.44 (H) 10/30/2022   No results found for: GFR    Component Value Date/Time   NA 135 10/30/2022 0139   K 3.4 (L) 10/30/2022 0139   CL 100 10/30/2022 0139  CO2 26 10/30/2022 0139   GLUCOSE 187 (H) 10/30/2022 0139   BUN 16 10/30/2022 0139   CREATININE 1.44 (H) 10/30/2022 0139   CALCIUM  8.6 (L) 10/30/2022 0139   PROT 6.6 10/26/2022 1346   ALBUMIN 3.5 10/26/2022 1346   AST 27 10/26/2022 1346   ALT 35 10/26/2022 1346   ALKPHOS 81 10/26/2022 1346   BILITOT 0.4 10/26/2022 1346   GFRNONAA 53 (L) 10/30/2022 0139      Latest Ref Rng & Units 10/30/2022    1:39 AM 10/29/2022   12:11 AM 10/28/2022    2:29 AM  BMP  Glucose 70 - 99 mg/dL 812  815  816   BUN 8 - 23 mg/dL 16  18  14    Creatinine 0.61 - 1.24 mg/dL 8.55  8.38  8.37   Sodium 135 - 145 mmol/L 135  134  139   Potassium 3.5 - 5.1 mmol/L 3.4  3.6  3.2   Chloride 98 - 111 mmol/L 100  100  101   CO2 22 - 32 mmol/L 26  24  25    Calcium  8.9 - 10.3 mg/dL 8.6  8.6  8.5        Component Value Date/Time   WBC 6.4 10/30/2022 0139   RBC 3.43 (L) 10/30/2022 0139   HGB 11.1 (L) 10/30/2022 0139   HCT 33.0 (L) 10/30/2022 0139   PLT 189 10/30/2022 0139   MCV 96.2 10/30/2022 0139   MCH 32.4 10/30/2022 0139   MCHC 33.6 10/30/2022 0139   RDW 14.2 10/30/2022 0139   LYMPHSABS 1.4 10/27/2022 0805   MONOABS 0.8 10/27/2022 0805   EOSABS 0.2 10/27/2022 0805   BASOSABS 0.1 10/27/2022 0805   No results found for: TSH, FREET4       Parts of this note may have been dictated using voice recognition software. There may be variances in spelling and vocabulary  which are unintentional. Not all errors are proofread. Please notify the dino if any discrepancies are noted or if the meaning of any statement is not clear.

## 2023-04-24 ENCOUNTER — Other Ambulatory Visit: Payer: Self-pay | Admitting: Cardiology

## 2023-04-26 MED ORDER — EMPAGLIFLOZIN 25 MG PO TABS: 25.0000 mg | ORAL_TABLET | Freq: Every day | ORAL | 1 refills | Status: DC

## 2023-06-10 DIAGNOSIS — E1129 Type 2 diabetes mellitus with other diabetic kidney complication: Secondary | ICD-10-CM | POA: Diagnosis not present

## 2023-06-10 DIAGNOSIS — E782 Mixed hyperlipidemia: Secondary | ICD-10-CM | POA: Diagnosis not present

## 2023-06-10 DIAGNOSIS — Z0001 Encounter for general adult medical examination with abnormal findings: Secondary | ICD-10-CM | POA: Diagnosis not present

## 2023-06-10 DIAGNOSIS — I119 Hypertensive heart disease without heart failure: Secondary | ICD-10-CM | POA: Diagnosis not present

## 2023-06-10 DIAGNOSIS — R809 Proteinuria, unspecified: Secondary | ICD-10-CM | POA: Diagnosis not present

## 2023-06-10 DIAGNOSIS — R972 Elevated prostate specific antigen [PSA]: Secondary | ICD-10-CM | POA: Diagnosis not present

## 2023-06-10 DIAGNOSIS — E559 Vitamin D deficiency, unspecified: Secondary | ICD-10-CM | POA: Diagnosis not present

## 2023-06-10 DIAGNOSIS — Z125 Encounter for screening for malignant neoplasm of prostate: Secondary | ICD-10-CM | POA: Diagnosis not present

## 2023-06-10 DIAGNOSIS — E1151 Type 2 diabetes mellitus with diabetic peripheral angiopathy without gangrene: Secondary | ICD-10-CM | POA: Diagnosis not present

## 2023-09-09 DIAGNOSIS — E782 Mixed hyperlipidemia: Secondary | ICD-10-CM | POA: Diagnosis not present

## 2023-09-09 DIAGNOSIS — E1151 Type 2 diabetes mellitus with diabetic peripheral angiopathy without gangrene: Secondary | ICD-10-CM | POA: Diagnosis not present

## 2023-09-09 DIAGNOSIS — R809 Proteinuria, unspecified: Secondary | ICD-10-CM | POA: Diagnosis not present

## 2023-09-09 DIAGNOSIS — E1129 Type 2 diabetes mellitus with other diabetic kidney complication: Secondary | ICD-10-CM | POA: Diagnosis not present

## 2023-09-09 DIAGNOSIS — I119 Hypertensive heart disease without heart failure: Secondary | ICD-10-CM | POA: Diagnosis not present

## 2023-09-09 DIAGNOSIS — E559 Vitamin D deficiency, unspecified: Secondary | ICD-10-CM | POA: Diagnosis not present

## 2023-09-15 ENCOUNTER — Ambulatory Visit (HOSPITAL_COMMUNITY)
Admission: RE | Admit: 2023-09-15 | Discharge: 2023-09-15 | Disposition: A | Discharge status: Home / Self Care | Payer: TRICARE For Life (TFL) | Source: Ambulatory Visit | Attending: Cardiology | Admitting: Cardiology

## 2023-09-15 ENCOUNTER — Ambulatory Visit: Payer: Self-pay | Admitting: Cardiology

## 2023-09-15 ENCOUNTER — Other Ambulatory Visit: Payer: Medicare Other

## 2023-09-15 DIAGNOSIS — I350 Nonrheumatic aortic (valve) stenosis: Secondary | ICD-10-CM | POA: Diagnosis not present

## 2023-09-15 LAB — ECHOCARDIOGRAM COMPLETE
AR max vel: 1.28 cm2
AV Area VTI: 1.32 cm2
AV Area mean vel: 1.25 cm2
AV Mean grad: 21 mmHg
AV Peak grad: 38.7 mmHg
Ao pk vel: 3.11 m/s
Area-P 1/2: 3.56 cm2
S' Lateral: 2.7 cm

## 2023-09-15 NOTE — Progress Notes (Signed)
 Bicuspid aortic valve with mild stenosis, mild regurgitation.  Recommend repeat echocardiogram in 1 year, followed by office visit.  Thanks MJP

## 2023-09-16 NOTE — Telephone Encounter (Signed)
 Pt returning call

## 2023-10-12 DIAGNOSIS — R350 Frequency of micturition: Secondary | ICD-10-CM | POA: Diagnosis not present

## 2023-10-12 DIAGNOSIS — Z125 Encounter for screening for malignant neoplasm of prostate: Secondary | ICD-10-CM | POA: Diagnosis not present

## 2023-10-12 DIAGNOSIS — N138 Other obstructive and reflux uropathy: Secondary | ICD-10-CM | POA: Diagnosis not present

## 2023-10-12 DIAGNOSIS — N401 Enlarged prostate with lower urinary tract symptoms: Secondary | ICD-10-CM | POA: Diagnosis not present

## 2023-10-12 DIAGNOSIS — N529 Male erectile dysfunction, unspecified: Secondary | ICD-10-CM | POA: Diagnosis not present

## 2023-10-28 DIAGNOSIS — E1129 Type 2 diabetes mellitus with other diabetic kidney complication: Secondary | ICD-10-CM | POA: Diagnosis not present

## 2023-10-28 DIAGNOSIS — E1151 Type 2 diabetes mellitus with diabetic peripheral angiopathy without gangrene: Secondary | ICD-10-CM | POA: Diagnosis not present

## 2023-10-28 DIAGNOSIS — R809 Proteinuria, unspecified: Secondary | ICD-10-CM | POA: Diagnosis not present

## 2023-10-28 DIAGNOSIS — E559 Vitamin D deficiency, unspecified: Secondary | ICD-10-CM | POA: Diagnosis not present

## 2023-10-28 DIAGNOSIS — I119 Hypertensive heart disease without heart failure: Secondary | ICD-10-CM | POA: Diagnosis not present

## 2023-10-28 DIAGNOSIS — E782 Mixed hyperlipidemia: Secondary | ICD-10-CM | POA: Diagnosis not present

## 2023-11-24 DIAGNOSIS — R350 Frequency of micturition: Secondary | ICD-10-CM | POA: Diagnosis not present

## 2023-11-24 DIAGNOSIS — N401 Enlarged prostate with lower urinary tract symptoms: Secondary | ICD-10-CM | POA: Diagnosis not present

## 2023-11-24 DIAGNOSIS — Z125 Encounter for screening for malignant neoplasm of prostate: Secondary | ICD-10-CM | POA: Diagnosis not present

## 2023-11-24 DIAGNOSIS — N529 Male erectile dysfunction, unspecified: Secondary | ICD-10-CM | POA: Diagnosis not present

## 2023-11-24 DIAGNOSIS — N138 Other obstructive and reflux uropathy: Secondary | ICD-10-CM | POA: Diagnosis not present

## 2023-11-25 DIAGNOSIS — E559 Vitamin D deficiency, unspecified: Secondary | ICD-10-CM | POA: Diagnosis not present

## 2023-11-25 DIAGNOSIS — R809 Proteinuria, unspecified: Secondary | ICD-10-CM | POA: Diagnosis not present

## 2023-11-25 DIAGNOSIS — E1151 Type 2 diabetes mellitus with diabetic peripheral angiopathy without gangrene: Secondary | ICD-10-CM | POA: Diagnosis not present

## 2023-11-25 DIAGNOSIS — E782 Mixed hyperlipidemia: Secondary | ICD-10-CM | POA: Diagnosis not present

## 2023-11-25 DIAGNOSIS — I119 Hypertensive heart disease without heart failure: Secondary | ICD-10-CM | POA: Diagnosis not present

## 2023-11-25 DIAGNOSIS — E1129 Type 2 diabetes mellitus with other diabetic kidney complication: Secondary | ICD-10-CM | POA: Diagnosis not present

## 2023-11-25 DIAGNOSIS — E876 Hypokalemia: Secondary | ICD-10-CM | POA: Diagnosis not present

## 2023-12-22 ENCOUNTER — Ambulatory Visit: Attending: Cardiology | Admitting: Cardiology

## 2023-12-22 ENCOUNTER — Other Ambulatory Visit (HOSPITAL_COMMUNITY): Payer: Self-pay

## 2023-12-22 ENCOUNTER — Encounter: Payer: Self-pay | Admitting: Cardiology

## 2023-12-22 VITALS — BP 158/100 | HR 63 | Ht 68.0 in | Wt 225.0 lb

## 2023-12-22 DIAGNOSIS — I10 Essential (primary) hypertension: Secondary | ICD-10-CM | POA: Diagnosis not present

## 2023-12-22 DIAGNOSIS — I350 Nonrheumatic aortic (valve) stenosis: Secondary | ICD-10-CM | POA: Diagnosis not present

## 2023-12-22 MED ORDER — LOSARTAN POTASSIUM 50 MG PO TABS
50.0000 mg | ORAL_TABLET | Freq: Every day | ORAL | 3 refills | Status: AC
  Filled 2023-12-22: qty 90, 90d supply, fill #0
  Filled 2024-03-11: qty 90, 90d supply, fill #1

## 2023-12-22 NOTE — Patient Instructions (Signed)
 Medication Instructions:  START Losartan  50 mg daily   *If you need a refill on your cardiac medications before your next appointment, please call your pharmacy*  Lab Work: BMP in 1 week  If you have labs (blood work) drawn today and your tests are completely normal, you will receive your results only by: MyChart Message (if you have MyChart) OR A paper copy in the mail If you have any lab test that is abnormal or we need to change your treatment, we will call you to review the results.  Testing/Procedures: Echocardiogram in 1 year  Your physician has requested that you have an echocardiogram. Echocardiography is a painless test that uses sound waves to create images of your heart. It provides your doctor with information about the size and shape of your heart and how well your heart's chambers and valves are working. This procedure takes approximately one hour. There are no restrictions for this procedure. Please do NOT wear cologne, perfume, aftershave, or lotions (deodorant is allowed). Please arrive 15 minutes prior to your appointment time.  Please note: We ask at that you not bring children with you during ultrasound (echo/ vascular) testing. Due to room size and safety concerns, children are not allowed in the ultrasound rooms during exams. Our front office staff cannot provide observation of children in our lobby area while testing is being conducted. An adult accompanying a patient to their appointment will only be allowed in the ultrasound room at the discretion of the ultrasound technician under special circumstances. We apologize for any inconvenience.   Follow-Up: At [facility], you and your health needs are our priority.  As part of our continuing mission to provide you with exceptional heart care, our providers are all part of one team.  This team includes your primary Cardiologist (physician) and Advanced Practice Providers or APPs (Physician Assistants and Nurse  Practitioners) who all work together to provide you with the care you need, when you need it.  Your next appointment:   4-6 weeks   Provider:   One of our Advanced Practice Providers (APPs): Morse Clause, PA-C  Lamarr Satterfield, NP Miriam Shams, NP  Olivia Pavy, PA-C Josefa Beauvais, NP  Leontine Salen, PA-C Orren Fabry, PA-C  [place], PA-C Ernest Dick, NP  Damien Braver, NP Jon Hails, PA-C  Waddell Donath, PA-C    Dayna Dunn, PA-C  Scott Weaver, PA-C Lum Louis, NP Katlyn West, NP Callie Goodrich, PA-C  Xika Zhao, NP Sheng Haley, PA-C    Kathleen Johnson, PA-C   Then, Newman JINNY Lawrence, MD will plan to see you again in 1 year(s).    We recommend signing up for the patient portal called MyChart.  Sign up information is provided on this After Visit Summary.  MyChart is used to connect with patients for Virtual Visits (Telemedicine).  Patients are able to view lab/test results, encounter notes, upcoming appointments, etc.  Non-urgent messages can be sent to your provider as well.   To learn more about what you can do with MyChart, go to [email or web address].   Other Instructions Referral to clinical trial

## 2023-12-22 NOTE — Progress Notes (Signed)
 Cardiology Office Note:  .   Date:  12/22/2023  ID:  Samuel Atkins, DOB 1956-03-07, MRN [id number] PCP: Catalina Bare, MD  [facility] Providers Cardiologist:  Newman Lawrence, MD PCP: Catalina Bare, MD  Chief Complaint  Patient presents with   Hypertension   Nonrheumatic aortic valve stenosis     Samuel Atkins is a 68 y.o. male with hypertension, type 2 diabetes mellitus, HFpEF, aortic stenosis   History of Present Illness  He stopped amlodipine -benazepril  due to lg swelling, which has since improved. Lightheadedness on standing up has also improved after stopping this medications. He denies chest pain, dyspnea, syncope. Reviewed recent echocardiogram results with the patient, details below.      Vitals:   12/22/23 1321  BP: (!) 158/100  Pulse: 63  SpO2: 94%      Review of Systems  Cardiovascular:  Negative for chest pain, dyspnea on exertion, leg swelling, palpitations and syncope.        Studies Reviewed: SABRA        EKG 12/22/2023: Normal sinus rhythm Nonspecific T wave abnormality When compared with ECG of 26-Oct-2022 13:20, No significant change was found    Echocardiogram 09/2023:  1. Appears functionally bicuspid with fusion of the left and non coronary cusps. The aortic valve is calcified. There is moderate calcification of the aortic valve. There is moderate thickening of the aortic valve. Aortic valve regurgitation is mild. Moderate aortic valve stenosis. Aortic valve area, by VTI measures 1.32 cm. Aortic valve mean gradient measures 21.0 mmHg. Aortic valve Vmax measures 3.11 m/s.   2. Left ventricular ejection fraction, by estimation, is 60 to 65%. The  left ventricle has normal function. The left ventricle has no regional  wall motion abnormalities. There is moderate concentric left ventricular  hypertrophy. Left ventricular  diastolic parameters are consistent with Grade II diastolic dysfunction  (pseudonormalization).  The average left ventricular global longitudinal  strain is -14.6 %. The global longitudinal strain is abnormal.   3. Right ventricular systolic function is normal. The right ventricular  size is normal.   4. Left atrial size was mildly dilated.   5. The mitral valve is normal in structure. No evidence of mitral valve  regurgitation. No evidence of mitral stenosis.   6. There is borderline dilatation of the ascending aorta, measuring 40  mm.   7. The inferior vena cava is normal in size with greater than 50%  respiratory variability, suggesting right atrial pressure of 3 mmHg.    Physical Exam Vitals and nursing note reviewed.  Constitutional:      General: He is not in acute distress. Neck:     Vascular: No JVD.  Cardiovascular:     Rate and Rhythm: Normal rate and regular rhythm.     Heart sounds: Murmur heard.     Harsh midsystolic murmur is present with a grade of 3/6 at the upper right sternal border radiating to the neck.  Pulmonary:     Effort: Pulmonary effort is normal.     Breath sounds: Normal breath sounds. No wheezing or rales.  Musculoskeletal:     Right lower leg: Edema (Trace) present.     Left lower leg: Edema (Trace) present.      VISIT DIAGNOSES:   ICD-10-CM   1. Primary hypertension  I10 EKG 12-Lead    Basic metabolic panel with GFR    2. Nonrheumatic aortic valve stenosis  I35.0 ECHOCARDIOGRAM COMPLETE       Samuel Atkins is  a 68 y.o. male with hypertension, type 2 diabetes mellitus, HFpEF, mild AS with likely bicuspid aortic valve.   Assessment & Plan  Hypertension:  Uncontrolled. Continue coreg  25 mg bid, hydralazine  100 mg tid. Add losartan  50 mg daily. Check BMP in 1 week. F/u in 4 weeks for hypertension follow up w/APP  Aortic stenosis: Functionally bicuspid aortic valve with moderate AS Repeat echocardiogram in 1 year. Refer to [facility] AV clinical trial.   Will also get results of recent labs checked w/PCP.      Meds  ordered this encounter  Medications   losartan  (COZAAR ) 50 MG tablet    Sig: Take 1 tablet (50 mg total) by mouth daily.    Dispense:  90 tablet    Refill:  3     F/u in 4 weeks w/APP, 1 year w/me  Signed, Newman JINNY Lawrence, MD

## 2024-01-25 ENCOUNTER — Encounter (HOSPITAL_BASED_OUTPATIENT_CLINIC_OR_DEPARTMENT_OTHER): Payer: Self-pay

## 2024-01-26 NOTE — Progress Notes (Deleted)
  Cardiology Office Note   Date:  01/26/2024  ID:  Amador, Braddy 1955/07/26, MRN [id number] PCP: Catalina Bare, MD  [facility] Providers Cardiologist:  Newman JINNY Lawrence, MD   History of Present Illness Cleto Claggett is a 68 y.o. male with a past medical history of hypertension, type 2 diabetes mellitus, HFpEF, aortic stenosis here for follow-up appointment.  When he was last seen a few weeks ago he had stopped his amlodipine /benazepril  due to swelling which has since improved.  Lightheadedness on standing up also improved after stopping his medication.  He denies any chest pain, dyspnea, syncope, lower extremity edema.  On EKG it showed a nonspecific T wave abnormality which is consistent with his previous EKG.  He was encouraged to continue carvedilol  25 mg twice daily, hydralazine  100 mg 3 times daily and losartan  50 mg daily was added to his regimen.  He was encouraged to go to the lab in a week to check a BMP and a follow-up in a few weeks to check in with his blood pressure.  Today, he***  ROS: ***  Studies Reviewed      Echocardiogram 09/2023:  1. Appears functionally bicuspid with fusion of the left and non coronary cusps. The aortic valve is calcified. There is moderate calcification of the aortic valve. There is moderate thickening of the aortic valve. Aortic valve regurgitation is mild. Moderate aortic valve stenosis. Aortic valve area, by VTI measures 1.32 cm. Aortic valve mean gradient measures 21.0 mmHg. Aortic valve Vmax measures 3.11 m/s.   2. Left ventricular ejection fraction, by estimation, is 60 to 65%. The  left ventricle has normal function. The left ventricle has no regional  wall motion abnormalities. There is moderate concentric left ventricular  hypertrophy. Left ventricular  diastolic parameters are consistent with Grade II diastolic dysfunction  (pseudonormalization). The average left ventricular global longitudinal  strain is  -14.6 %. The global longitudinal strain is abnormal.   3. Right ventricular systolic function is normal. The right ventricular  size is normal.   4. Left atrial size was mildly dilated.   5. The mitral valve is normal in structure. No evidence of mitral valve  regurgitation. No evidence of mitral stenosis.   6. There is borderline dilatation of the ascending aorta, measuring 40  mm.   7. The inferior vena cava is normal in size with greater than 50%  respiratory variability, suggesting right atrial pressure of 3 mmHg.    Risk Assessment/Calculations {Does this patient have ATRIAL FIBRILLATION?[phone]} No BP recorded.  {Refresh Note OR Click here to enter BP  :1}***       Physical Exam VS:  There were no vitals taken for this visit.       Wt Readings from Last 3 Encounters:  12/22/23 225 lb (102.1 kg)  04/20/23 228 lb (103.4 kg)  11/27/22 235 lb (106.6 kg)    GEN: Well nourished, well developed in no acute distress NECK: No JVD; No carotid bruits CARDIAC: ***RRR, no murmurs, rubs, gallops RESPIRATORY:  Clear to auscultation without rales, wheezing or rhonchi  ABDOMEN: Soft, non-tender, non-distended EXTREMITIES:  No edema; No deformity   ASSESSMENT AND PLAN Hypertension Aortic stenosis    {Are you ordering a CV Procedure (e.g. stress test, cath, DCCV, TEE, etc)?   Press F2        :[id number]}  Dispo: ***  Signed, Orren LOISE Fabry, PA-C

## 2024-01-27 ENCOUNTER — Ambulatory Visit: Attending: Internal Medicine | Admitting: Physician Assistant

## 2024-01-27 DIAGNOSIS — I10 Essential (primary) hypertension: Secondary | ICD-10-CM

## 2024-01-27 DIAGNOSIS — I350 Nonrheumatic aortic (valve) stenosis: Secondary | ICD-10-CM

## 2024-02-24 ENCOUNTER — Ambulatory Visit: Payer: Medicare Other | Admitting: Cardiology

## 2024-03-13 ENCOUNTER — Other Ambulatory Visit (HOSPITAL_COMMUNITY): Payer: Self-pay

## 2024-03-23 ENCOUNTER — Other Ambulatory Visit (HOSPITAL_COMMUNITY): Payer: Self-pay

## 2024-12-21 ENCOUNTER — Other Ambulatory Visit (HOSPITAL_COMMUNITY)
